# Patient Record
Sex: Male | Born: 1990 | Race: Black or African American | Hispanic: No | Marital: Single | Smoking: Current some day smoker
Health system: Southern US, Community
[De-identification: ages and names within clinical notes are randomized; demographics above are authoritative.]

---
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
## 2012-09-02 ENCOUNTER — Encounter (HOSPITAL_COMMUNITY): Payer: Self-pay | Admitting: *Deleted

## 2012-09-02 ENCOUNTER — Emergency Department (HOSPITAL_COMMUNITY)
Admission: EM | Admit: 2012-09-02 | Discharge: 2012-09-02 | Disposition: A | Discharge status: Home / Self Care | Payer: Medicaid Other | Attending: Emergency Medicine | Admitting: Emergency Medicine

## 2012-09-02 DIAGNOSIS — R51 Headache: Secondary | ICD-10-CM | POA: Insufficient documentation

## 2012-09-02 DIAGNOSIS — H53149 Visual discomfort, unspecified: Secondary | ICD-10-CM | POA: Insufficient documentation

## 2012-09-02 MED ORDER — KETOROLAC TROMETHAMINE 60 MG/2ML IM SOLN
60.0000 mg | Freq: Once | INTRAMUSCULAR | Status: AC
  Administered 2012-09-02: 60 mg via INTRAMUSCULAR
  Filled 2012-09-02: qty 2

## 2012-09-02 NOTE — ED Provider Notes (Signed)
  I performed a history and physical examination of Jimmy Barr and discussed his management with Dr. Freida Busman  I agree with the history, physical, assessment, and plan of care, with the following exceptions: None  On my exam this young male was in no distress.  Given his description of a gradually developing headache, the absence of distress, neurologic changes, there is little suspicion for subarachnoid or meningitis.  The patient had resolution of his pain with analgesics.  He was discharged in stable condition.  Elyse Jarvis, MD 09/02/12 [phone]

## 2012-09-02 NOTE — ED Notes (Signed)
Pt here c/o H/A that started last night.  Pt denies taking any medications for pain relief.

## 2012-09-02 NOTE — ED Provider Notes (Signed)
History     CSN: [id number]  Arrival date & time 09/02/12  5784   First MD Initiated Contact with Patient 09/02/12 0845     Chief Complaint  Patient presents with  . Headache   HPI: Jimmy Barr is a 21 yo AAM with no past medical history presents with headache. Symptoms started last night at 9 pm while watching TV. Pain described as throbbing, located in bilateral parietal regions, migrated to occiput over a couple of hours. Took >30 minutes to reach maximum intensity of 9/10. Pain then began to improve slowly but did not resolve completely. He did not take any medications for relief. Associated with photophobia and feeling of warmth. Two hours ago intensity of pain began to worsen again so he presents for evaluation. Currently pain is a 5/10. He denies nausea, vomiting, extremity weakness, neck pain or fever. His mother has history of migraines. He has never had a headache similar to this previously.   History reviewed. No pertinent past medical history.  History reviewed. No pertinent past surgical history.  No family history on file.  History  Substance Use Topics  . Smoking status: Never Smoker   . Smokeless tobacco: Not on file  . Alcohol Use: Yes     Comment: social     Review of Systems  Constitutional: Negative for fever, chills and fatigue.  HENT: Negative for trouble swallowing, neck pain and neck stiffness.   Eyes: Positive for photophobia. Negative for pain.  Respiratory: Negative for cough, shortness of breath and wheezing.   Cardiovascular: Negative for chest pain and palpitations.  Gastrointestinal: Negative for nausea, vomiting and abdominal pain.  Genitourinary: Negative for dysuria, urgency and decreased urine volume.  Musculoskeletal: Negative for myalgias, back pain and arthralgias.  Skin: Negative for pallor and rash.  Neurological: Positive for headaches. Negative for dizziness, seizures, syncope and weakness.  Psychiatric/Behavioral: Negative for  confusion and agitation.   Allergies  Review of patient's allergies indicates no known allergies.  Home Medications  No current outpatient prescriptions on file.  BP 125/89  Pulse 79  Temp 98.1 F (36.7 C) (Oral)  Resp 16  Ht 5\' 6"  (1.676 m)  Wt 173 lb (78.472 kg)  BMI 27.92 kg/m2  SpO2 100%  Physical Exam  Vitals reviewed. Constitutional: He is oriented to person, place, and time. He appears well-developed and well-nourished. He is cooperative. No distress.  HENT:  Head: Normocephalic and atraumatic.  Mouth/Throat: No oropharyngeal exudate.  Eyes: Conjunctivae normal and EOM are normal. Pupils are equal, round, and reactive to light.  Neck: Normal range of motion. Neck supple. No JVD present.  Cardiovascular: Normal rate, regular rhythm and intact distal pulses.   Pulmonary/Chest: Effort normal and breath sounds normal. He exhibits no tenderness.  Abdominal: Soft. Bowel sounds are normal. There is no tenderness. There is no rebound.  Musculoskeletal: Normal range of motion.  Neurological: He is alert and oriented to person, place, and time. He has normal strength and normal reflexes. No cranial nerve deficit or sensory deficit. GCS eye subscore is 4. GCS verbal subscore is 5. GCS motor subscore is 6.  Skin: Skin is warm and dry.   ED Course  Procedures  Labs Reviewed - No data to display No results found.  1. Headache     MDM  21 yo AAM with no past medical history presents with headache. Afebrile, vital signs stable. Doubt SAH due to gradual onset, no neck pain, symptoms improving spontaneously. Doubt meningitis as no fever or neck  pain. Feel this is most likely a tension HA, although this may represent a first-time migraine due to family history. No focal neurologic deficits to warrant head imaging. No other complaints to warrant labs. Treated with Toradol with near resolution of pain. Felt to be stable for outpatient management as HA much improved, he is ambulating  without difficulty, no vomiting. Instructed to f/u with PCP of his choice if headaches continue for further evaluation. Return precautions given.   Discussed case with Dr. Jeraldine Loots  Clinical Impression 1. Headache        Margie Billet, MD 09/02/12 1427

## 2012-09-20 ENCOUNTER — Encounter (HOSPITAL_COMMUNITY): Payer: Self-pay | Admitting: Cardiology

## 2012-09-20 ENCOUNTER — Emergency Department (HOSPITAL_COMMUNITY)
Admission: EM | Admit: 2012-09-20 | Discharge: 2012-09-20 | Disposition: A | Discharge status: Home / Self Care | Payer: Medicaid Other | Attending: Emergency Medicine | Admitting: Emergency Medicine

## 2012-09-20 DIAGNOSIS — R112 Nausea with vomiting, unspecified: Secondary | ICD-10-CM | POA: Insufficient documentation

## 2012-09-20 DIAGNOSIS — R197 Diarrhea, unspecified: Secondary | ICD-10-CM | POA: Insufficient documentation

## 2012-09-20 DIAGNOSIS — R10819 Abdominal tenderness, unspecified site: Secondary | ICD-10-CM | POA: Insufficient documentation

## 2012-09-20 NOTE — ED Notes (Signed)
Pt reports n/v/d since last night. Reports vomiting x3, and loose stools. Also abd tenderness. Denies any fevers.

## 2012-09-20 NOTE — ED Notes (Signed)
Called patient for vital recheck, no answer

## 2013-08-07 ENCOUNTER — Encounter (HOSPITAL_COMMUNITY): Payer: Self-pay | Admitting: Emergency Medicine

## 2013-08-07 ENCOUNTER — Emergency Department (HOSPITAL_COMMUNITY)
Admission: EM | Admit: 2013-08-07 | Discharge: 2013-08-07 | Disposition: A | Discharge status: Home / Self Care | Payer: Medicaid Other | Attending: Emergency Medicine | Admitting: Emergency Medicine

## 2013-08-07 DIAGNOSIS — T679XXA Effect of heat and light, unspecified, initial encounter: Secondary | ICD-10-CM

## 2013-08-07 DIAGNOSIS — Y929 Unspecified place or not applicable: Secondary | ICD-10-CM | POA: Insufficient documentation

## 2013-08-07 DIAGNOSIS — W92XXXA Exposure to excessive heat of man-made origin, initial encounter: Secondary | ICD-10-CM | POA: Insufficient documentation

## 2013-08-07 DIAGNOSIS — Y939 Activity, unspecified: Secondary | ICD-10-CM | POA: Insufficient documentation

## 2013-08-07 DIAGNOSIS — T678XXA Other effects of heat and light, initial encounter: Secondary | ICD-10-CM | POA: Insufficient documentation

## 2013-08-07 LAB — URINALYSIS, ROUTINE W REFLEX MICROSCOPIC
Bilirubin Urine: NEGATIVE
Glucose, UA: NEGATIVE mg/dL
Leukocytes, UA: NEGATIVE
Nitrite: NEGATIVE
Protein, ur: NEGATIVE mg/dL
Urobilinogen, UA: 1 mg/dL (ref 0.0–1.0)

## 2013-08-07 LAB — CBC WITH DIFFERENTIAL/PLATELET
Basophils Absolute: 0 10*3/uL (ref 0.0–0.1)
Basophils Relative: 0 % (ref 0–1)
HCT: 43.7 % (ref 39.0–52.0)
Hemoglobin: 14.6 g/dL (ref 13.0–17.0)
Lymphocytes Relative: 28 % (ref 12–46)
Lymphs Abs: 2.6 10*3/uL (ref 0.7–4.0)
MCH: 26.3 pg (ref 26.0–34.0)
MCHC: 33.4 g/dL (ref 30.0–36.0)
MCV: 78.6 fL (ref 78.0–100.0)
Monocytes Absolute: 0.9 10*3/uL (ref 0.1–1.0)
Monocytes Relative: 10 % (ref 3–12)
Neutro Abs: 5.6 10*3/uL (ref 1.7–7.7)
Neutrophils Relative %: 61 % (ref 43–77)
Platelets: 302 10*3/uL (ref 150–400)
RBC: 5.56 MIL/uL (ref 4.22–5.81)
RDW: 14 % (ref 11.5–15.5)
WBC: 9.2 10*3/uL (ref 4.0–10.5)

## 2013-08-07 LAB — COMPREHENSIVE METABOLIC PANEL
ALT: 17 U/L (ref 0–53)
AST: 23 U/L (ref 0–37)
Albumin: 4.1 g/dL (ref 3.5–5.2)
Alkaline Phosphatase: 71 U/L (ref 39–117)
BUN: 11 mg/dL (ref 6–23)
CO2: 24 mEq/L (ref 19–32)
Calcium: 9.6 mg/dL (ref 8.4–10.5)
Chloride: 101 mEq/L (ref 96–112)
Creatinine, Ser: 1.04 mg/dL (ref 0.50–1.35)
GFR calc Af Amer: >90 mL/min (ref >90)
GFR calc non Af Amer: >90 mL/min (ref >90)
Glucose, Bld: 140 mg/dL — ABNORMAL HIGH (ref 70–99)
Potassium: 4.3 mEq/L (ref 3.5–5.1)
Sodium: 137 mEq/L (ref 135–145)
Total Bilirubin: 0.2 mg/dL — ABNORMAL LOW (ref 0.3–1.2)
Total Protein: 8.1 g/dL (ref 6.0–8.3)

## 2013-08-07 MED ORDER — SODIUM CHLORIDE 0.9 % IV BOLUS (SEPSIS)
1000.0000 mL | Freq: Once | INTRAVENOUS | Status: AC
  Administered 2013-08-07: 1000 mL via INTRAVENOUS

## 2013-08-07 MED ORDER — ONDANSETRON HCL 4 MG/2ML IJ SOLN
4.0000 mg | Freq: Once | INTRAMUSCULAR | Status: AC
  Administered 2013-08-07: 4 mg via INTRAVENOUS
  Filled 2013-08-07: qty 2

## 2013-08-07 MED ORDER — PROMETHAZINE HCL 25 MG PO TABS: 25.0000 mg | ORAL_TABLET | Freq: Four times a day (QID) | ORAL | Status: DC | PRN

## 2013-08-07 NOTE — ED Provider Notes (Signed)
CSN: [id number]     Arrival date & time 08/07/13  0531 History   None    Chief Complaint  Patient presents with  . Influenza    Flu like symptoms   (Consider location/radiation/quality/duration/timing/severity/associated sxs/prior Treatment) Patient is a 22 y.o. male presenting with flu symptoms. The history is provided by the patient. No language interpreter was used.  Influenza Presenting symptoms: fever   Severity:  Moderate Onset quality:  Sudden Progression:  Worsening Chronicity:  New Relieved by:  Nothing Worsened by:  Nothing tried Ineffective treatments:  Drinking Associated symptoms: decreased physical activity   Risk factors: not elderly   Pt reports he was lying over the heat vent at home and began feeling very hot.   Pt is unsure if he was overheated or if he has a fever  History reviewed. No pertinent past medical history. History reviewed. No pertinent past surgical history. No family history on file. History  Substance Use Topics  . Smoking status: Never Smoker   . Smokeless tobacco: Not on file  . Alcohol Use: Yes     Comment: social    Review of Systems  Constitutional: Positive for fever.  Skin: Positive for color change.  All other systems reviewed and are negative.    Allergies  Review of patient's allergies indicates no known allergies.  Home Medications  No current outpatient prescriptions on file. BP 113/71  Pulse 65  Temp(Src) 97.8 F (36.6 C) (Oral)  Resp 17  SpO2 98% Physical Exam  Nursing note and vitals reviewed. Constitutional: He appears well-developed and well-nourished.  HENT:  Head: Normocephalic.  Right Ear: External ear normal.  Left Ear: External ear normal.  Nose: Nose normal.  Mouth/Throat: Oropharynx is clear and moist.  Eyes: Pupils are equal, round, and reactive to light.  Neck: Normal range of motion.  Cardiovascular: Normal rate and regular rhythm.   Pulmonary/Chest: Effort normal and breath sounds normal.   Abdominal: Soft. Bowel sounds are normal.  Musculoskeletal: Normal range of motion.  Neurological: He is alert.  Skin: Skin is warm.  Psychiatric: He has a normal mood and affect.    ED Course  Procedures (including critical care time) Labs Review Labs Reviewed  COMPREHENSIVE METABOLIC PANEL - Abnormal; Notable for the following:    Glucose, Bld 140 (*)    Total Bilirubin 0.2 (*)    All other components within normal limits  URINALYSIS, ROUTINE W REFLEX MICROSCOPIC - Abnormal; Notable for the following:    APPearance CLOUDY (*)    Ketones, ur 15 (*)    All other components within normal limits  CBC WITH DIFFERENTIAL   Imaging Review No results found.  EKG Interpretation   None       MDM   1. Heat exposure    Pt given IV fluids,    Pt feels better.   Pt advised tylenol at home.  See your primary Md for recheck if symptoms persist    Elson Areas, [place] 08/07/13 7829

## 2013-08-07 NOTE — ED Notes (Signed)
Patient reports experiencing n/v this am as well as episodes of "passing out." Patient is also reporting that he had a "heat-stroke."

## 2013-08-10 NOTE — ED Provider Notes (Signed)
Medical screening examination/treatment/procedure(s) were performed by non-physician practitioner and as supervising physician I was immediately available for consultation/collaboration.     Brandt Loosen, MD 08/10/13 [phone]

## 2016-08-26 ENCOUNTER — Emergency Department (HOSPITAL_COMMUNITY)
Admission: EM | Admit: 2016-08-26 | Discharge: 2016-08-27 | Disposition: A | Discharge status: Home / Self Care | Payer: Medicaid Other | Attending: Emergency Medicine | Admitting: Emergency Medicine

## 2016-08-26 ENCOUNTER — Encounter (HOSPITAL_COMMUNITY): Payer: Self-pay | Admitting: Pharmacy Technician

## 2016-08-26 DIAGNOSIS — R2232 Localized swelling, mass and lump, left upper limb: Secondary | ICD-10-CM | POA: Insufficient documentation

## 2016-08-26 DIAGNOSIS — M7981 Nontraumatic hematoma of soft tissue: Secondary | ICD-10-CM | POA: Insufficient documentation

## 2016-08-26 DIAGNOSIS — M7989 Other specified soft tissue disorders: Secondary | ICD-10-CM

## 2016-08-26 DIAGNOSIS — F17200 Nicotine dependence, unspecified, uncomplicated: Secondary | ICD-10-CM | POA: Insufficient documentation

## 2016-08-26 NOTE — ED Triage Notes (Signed)
Pt arrives to the ED with complaints of swelling and pain in his L arm above the injection site from donating plasma. Pt was at the plasma donation center today and states they had to stick him twice due to machine malfunction. Pt states they had trouble getting the vein in the left arm and when they initially started the procedure it did not feel right. States after they finished getting the plasma and were returning blood into his arm it started swelling. Pt states that it has gotten more swollen throughout the day.

## 2016-08-27 ENCOUNTER — Inpatient Hospital Stay (HOSPITAL_COMMUNITY): Admission: RE | Admit: 2016-08-27 | Payer: Medicaid Other | Source: Ambulatory Visit

## 2016-08-27 NOTE — ED Provider Notes (Signed)
[facility]EM[facility]Y DEPT Provider Note   CSN: [id number] Arrival date & time: 08/26/16  2251     History   Chief Complaint No chief complaint on file.   HPI Jimmy Barr is a 25 y.o. male.  HPI   Patient is a 25 year old male with no pertinent past medical history presents the ED with complaint of left arm swelling and bruising, onset 8:30 AM. Patient reports donating plasma this morning and states when they were returning his blood back he began to have small amount of swelling to his left arm above where his IV was present. He notes his afternoon he began having bruising to the site. Endorses associated mild tenderness with certain movements of his left arm. Patient reports his mother was concerned he may have a blood clot in his arm resulting in him coming to the ED for evaluation. Patient denies personal history of blood clots but reports his and had a blood clot resulting in her losing one of her legs. Denies fever, numbness, tingling, weakness, decreased range of motion, chest pain, shortness of breath.   History reviewed. No pertinent past medical history.  There are no active problems to display for this patient.   History reviewed. No pertinent surgical history.     Home Medications    Prior to Admission medications   Medication Sig Start Date End Date Taking? Authorizing Provider  promethazine (PHENERGAN) 25 MG tablet Take 1 tablet (25 mg total) by mouth every 6 (six) hours as needed for nausea or vomiting. 08/07/13   Elson AreasLeslie K Sofia, PA-C    Family History History reviewed. No pertinent family history.  Social History Social History  Substance Use Topics  . Smoking status: Current Some Day Smoker    Packs/day: 0.10  . Smokeless tobacco: Never Used  . Alcohol use Yes     Comment: social     Allergies   Patient has no known allergies.   Review of Systems Review of Systems  Musculoskeletal: Positive for myalgias (left arm).       Left arm swelling and  bruising  All other systems reviewed and are negative.    Physical Exam Updated Vital Signs BP 135/83 (BP Location: Right Arm)   Pulse 77   Temp 98.3 F (36.8 C) (Oral)   Resp 18   Ht 5\' 6"  (1.676 m)   Wt 93.4 kg   SpO2 98%   BMI 33.25 kg/m   Physical Exam  Constitutional: He is oriented to person, place, and time. He appears well-developed and well-nourished.  HENT:  Head: Normocephalic and atraumatic.  Eyes: Conjunctivae and EOM are normal. Right eye exhibits no discharge. Left eye exhibits no discharge. No scleral icterus.  Neck: Normal range of motion. Neck supple.  Cardiovascular: Normal rate, regular rhythm, normal heart sounds and intact distal pulses.   Pulmonary/Chest: Effort normal and breath sounds normal. No respiratory distress. He has no wheezes. He has no rales. He exhibits no tenderness.  Abdominal: Soft.  Musculoskeletal: Normal range of motion. He exhibits tenderness. He exhibits no edema or deformity.  Mild swelling and ecchymoses noted to left antecubital space; mild tenderness palpation. No erythema, warmth or streaking noted. Full range of motion of left upper extremity with 5 out of 5 strength. Sensation grossly intact. 2+ radial pulse. Cap refill less than 2.  Neurological: He is alert and oriented to person, place, and time.  Skin: Skin is warm and dry.  Nursing note and vitals reviewed.    ED Treatments /  Results  Labs (all labs ordered are listed, but only abnormal results are displayed) Labs Reviewed - No data to display  EKG  EKG Interpretation None       Radiology No results found.  Procedures Procedures (including critical care time)  Medications Ordered in ED Medications - No data to display   Initial Impression / Assessment and Plan / ED Course  I have reviewed the triage vital signs and the nursing notes.  Pertinent labs & imaging results that were available during my care of the patient were reviewed by me and considered  in my medical decision making (see chart for details).  Clinical Course     Patient presents with mild swelling and bruising to his left arm after donating plasma this morning. He reports his mother was concerned about him having a blood clot resulting in him coming to the ED. Endorses family history of blood clot, denies personal history. VSS. Exam revealed mild swelling and ecchymoses to left antecubital space. Left upper extremity neurovascularly intact. No signs of infection/cellulitis. No streaking. Plan to discharge patient home with outpatient Doppler study to be performed in the morning for evaluation of blood clot. Discussed plan with patient along with instructions regarding tomorrow as follow-up procedure. Patient reports understanding and agreement. Discussed return precautions.  Final Clinical Impressions(s) / ED Diagnoses   Final diagnoses:  Left arm swelling    New Prescriptions New Prescriptions   No medications on file     Barrett Henleicole Elizabeth Tamotsu Wiederholt, PA-C 08/27/16 0033    Dione Boozeavid Glick, MD 08/27/16 [phone]

## 2016-08-27 NOTE — ED Notes (Signed)
Patient Alert and oriented X4. Stable and ambulatory. Patient verbalized understanding of the discharge instructions.  Patient belongings were taken by the patient.  

## 2016-08-27 NOTE — ED Notes (Signed)
Patient is A&Ox4 at this time.  Patient in no signs of distress.  Please see providers note for complete history and physical exam.  

## 2016-08-27 NOTE — Discharge Instructions (Signed)
Follow the instructions listed below to return to the hospital tomorrow to have your left upper extremity ultrasound performed for evaluation of a blood clot. Please return to the Emergency Department if symptoms worsen or new onset of fever, redness, swelling, numbness, tingling, weakness, decreased range of motion, chest pain, shortness of breath.

## 2016-08-28 ENCOUNTER — Ambulatory Visit (HOSPITAL_COMMUNITY)
Admission: RE | Admit: 2016-08-28 | Discharge: 2016-08-28 | Disposition: A | Discharge status: Home / Self Care | Payer: Medicaid Other | Source: Ambulatory Visit | Attending: Emergency Medicine | Admitting: Emergency Medicine

## 2016-08-28 DIAGNOSIS — M7989 Other specified soft tissue disorders: Secondary | ICD-10-CM | POA: Insufficient documentation

## 2016-08-28 DIAGNOSIS — M79609 Pain in unspecified limb: Secondary | ICD-10-CM

## 2016-08-28 NOTE — Progress Notes (Signed)
VASCULAR LAB PRELIMINARY  PRELIMINARY  PRELIMINARY  PRELIMINARY  Left upper extremity venous duplex completed.    Preliminary report:  There is no DVT or SVT noted in the left upper extremity.   Anquan Azzarello, RVT 08/28/2016, 9:53 AM

## 2017-02-06 ENCOUNTER — Encounter (HOSPITAL_COMMUNITY): Payer: Self-pay

## 2017-02-06 ENCOUNTER — Emergency Department (HOSPITAL_COMMUNITY): Payer: Self-pay

## 2017-02-06 ENCOUNTER — Emergency Department (HOSPITAL_COMMUNITY)
Admission: EM | Admit: 2017-02-06 | Discharge: 2017-02-06 | Disposition: A | Discharge status: Home / Self Care | Payer: Self-pay | Attending: Emergency Medicine | Admitting: Emergency Medicine

## 2017-02-06 DIAGNOSIS — R112 Nausea with vomiting, unspecified: Secondary | ICD-10-CM | POA: Insufficient documentation

## 2017-02-06 DIAGNOSIS — F17200 Nicotine dependence, unspecified, uncomplicated: Secondary | ICD-10-CM | POA: Insufficient documentation

## 2017-02-06 DIAGNOSIS — R42 Dizziness and giddiness: Secondary | ICD-10-CM | POA: Insufficient documentation

## 2017-02-06 DIAGNOSIS — Z79899 Other long term (current) drug therapy: Secondary | ICD-10-CM | POA: Insufficient documentation

## 2017-02-06 LAB — BASIC METABOLIC PANEL
Anion gap: 8 (ref 5–15)
BUN: 8 mg/dL (ref 6–20)
CO2: 26 mmol/L (ref 22–32)
Calcium: 9.3 mg/dL (ref 8.9–10.3)
Chloride: 104 mmol/L (ref 101–111)
Creatinine, Ser: 1.23 mg/dL (ref 0.61–1.24)
GFR calc Af Amer: >60 mL/min (ref >60)
GFR calc non Af Amer: >60 mL/min (ref >60)
Glucose, Bld: 147 mg/dL — ABNORMAL HIGH (ref 65–99)
Potassium: 3.7 mmol/L (ref 3.5–5.1)
Sodium: 138 mmol/L (ref 135–145)

## 2017-02-06 LAB — CBC WITH DIFFERENTIAL/PLATELET
Basophils Absolute: 0 10*3/uL (ref 0.0–0.1)
Basophils Relative: 0 %
Eosinophils Absolute: 0.2 10*3/uL (ref 0.0–0.7)
Eosinophils Relative: 2 %
HCT: 43.1 % (ref 39.0–52.0)
Hemoglobin: 14 g/dL (ref 13.0–17.0)
Lymphocytes Relative: 26 %
Lymphs Abs: 1.7 10*3/uL (ref 0.7–4.0)
MCH: 26.4 pg (ref 26.0–34.0)
MCHC: 32.5 g/dL (ref 30.0–36.0)
MCV: 81.2 fL (ref 78.0–100.0)
Monocytes Absolute: 0.6 10*3/uL (ref 0.1–1.0)
Monocytes Relative: 10 %
Neutro Abs: 4 10*3/uL (ref 1.7–7.7)
Neutrophils Relative %: 62 %
Platelets: 270 10*3/uL (ref 150–400)
RBC: 5.31 MIL/uL (ref 4.22–5.81)
RDW: 15.2 % (ref 11.5–15.5)
WBC: 6.5 10*3/uL (ref 4.0–10.5)

## 2017-02-06 LAB — URINALYSIS, ROUTINE W REFLEX MICROSCOPIC
Bacteria, UA: NONE SEEN
Bilirubin Urine: NEGATIVE
Glucose, UA: NEGATIVE mg/dL
Hgb urine dipstick: NEGATIVE
Ketones, ur: 20 mg/dL — AB
Leukocytes, UA: NEGATIVE
Nitrite: NEGATIVE
Protein, ur: 30 mg/dL — AB
Specific Gravity, Urine: 1.027 (ref 1.005–1.030)
pH: 6 (ref 5.0–8.0)

## 2017-02-06 MED ORDER — LORAZEPAM 2 MG/ML IJ SOLN
2.0000 mg | Freq: Once | INTRAMUSCULAR | Status: AC
  Administered 2017-02-06: 2 mg via INTRAVENOUS
  Filled 2017-02-06: qty 1

## 2017-02-06 MED ORDER — SODIUM CHLORIDE 0.9 % IV BOLUS (SEPSIS)
1000.0000 mL | Freq: Once | INTRAVENOUS | Status: AC
  Administered 2017-02-06: 1000 mL via INTRAVENOUS

## 2017-02-06 MED ORDER — ONDANSETRON HCL 4 MG PO TABS: 4.0000 mg | ORAL_TABLET | Freq: Four times a day (QID) | ORAL | 0 refills | Status: DC

## 2017-02-06 MED ORDER — DIPHENHYDRAMINE HCL 50 MG/ML IJ SOLN
25.0000 mg | Freq: Once | INTRAMUSCULAR | Status: AC
  Administered 2017-02-06: 25 mg via INTRAVENOUS
  Filled 2017-02-06: qty 1

## 2017-02-06 MED ORDER — MECLIZINE HCL 25 MG PO TABS
25.0000 mg | ORAL_TABLET | Freq: Once | ORAL | Status: AC
Start: 2017-02-06 — End: 2017-02-06
  Administered 2017-02-06: 25 mg via ORAL
  Filled 2017-02-06: qty 1

## 2017-02-06 MED ORDER — SODIUM CHLORIDE 0.9 % IV BOLUS (SEPSIS): 1000.0000 mL | Freq: Once | INTRAVENOUS | Status: DC

## 2017-02-06 MED ORDER — ONDANSETRON HCL 4 MG/2ML IJ SOLN
4.0000 mg | Freq: Once | INTRAMUSCULAR | Status: AC
Start: 2017-02-06 — End: 2017-02-06
  Administered 2017-02-06: 4 mg via INTRAVENOUS
  Filled 2017-02-06: qty 2

## 2017-02-06 MED ORDER — MECLIZINE HCL 25 MG PO TABS: 25.0000 mg | ORAL_TABLET | Freq: Three times a day (TID) | ORAL | 0 refills | Status: DC | PRN

## 2017-02-06 NOTE — ED Triage Notes (Signed)
Pr Pt, Pt is coming from home with complaints of dizziness and unsteady gait that started last night at 0000 when he woke up from his sleep to go to the bathroom. Pt reports waking up again this morning and having nausea and vomiting. Reports dizziness and lightheadedness continued.

## 2017-02-06 NOTE — ED Notes (Addendum)
Patient is resting comfortably; returned from CT; no needs

## 2017-02-06 NOTE — ED Provider Notes (Signed)
[facility] Provider Note   CSN: [id number] Arrival date & time: 02/06/17  0849     History   Chief Complaint Chief Complaint  Patient presents with  . Dizziness  . Nausea    HPI Jimmy E Derrell Lollingngram is a 26 y.o. male who is previously healthy who presents with sudden onset of dizziness around 12 AM this morning. Patient states he woke up in the middle night and then began feeling dizzy, feeling the room is spinning. He has had associated nausea and vomiting. Patient states his symptoms are worse with moving his head and walking. Patient reports having a gradual onset headache yesterday that resolved with one ibuprofen, but otherwise felt normal yesterday. Patient does feel like he has had a decreased appetite in the past couple weeks. Patient reports feeling the urge to urinate, but has not been able to. He last urinated last night. He has been drinking fluids since that time. Patient denies any pain, but does describe a fullness in his head. He denies any fevers, chest pain, shortness of breath, ear pain, abdominal pain. Patient does note drinking a lot of alcohol 2 days ago at a party. He has not very much water over the past couple days.  HPI  History reviewed. No pertinent past medical history.  There are no active problems to display for this patient.   History reviewed. No pertinent surgical history.     Home Medications    Prior to Admission medications   Medication Sig Start Date End Date Taking? Authorizing Provider  ibuprofen (ADVIL,MOTRIN) 200 MG tablet Take 200 mg by mouth every 6 (six) hours as needed for mild pain.   Yes [provider]  meclizine (ANTIVERT) 25 MG tablet Take 1 tablet (25 mg total) by mouth 3 (three) times daily as needed for dizziness. 02/06/17   Raigan Baria, Waylan BogaAlexandra M, PA-C  ondansetron (ZOFRAN) 4 MG tablet Take 1 tablet (4 mg total) by mouth every 6 (six) hours. 02/06/17   Odysseus Cada, Waylan BogaAlexandra M, PA-C  promethazine (PHENERGAN) 25 MG tablet  Take 1 tablet (25 mg total) by mouth every 6 (six) hours as needed for nausea or vomiting. Patient not taking: Reported on 02/06/2017 08/07/13   Osie CheeksSofia, Leslie K, PA-C    Family History No family history on file.  Social History Social History  Substance Use Topics  . Smoking status: Current Some Day Smoker    Packs/day: 0.10  . Smokeless tobacco: Never Used  . Alcohol use Yes     Comment: social     Allergies   Patient has no known allergies.   Review of Systems Review of Systems  Constitutional: Positive for appetite change. Negative for chills and fever.  HENT: Negative for facial swelling and sore throat.   Respiratory: Negative for shortness of breath.   Cardiovascular: Negative for chest pain.  Gastrointestinal: Positive for nausea and vomiting. Negative for abdominal pain.  Genitourinary: Positive for decreased urine volume, difficulty urinating and urgency. Negative for dysuria.  Musculoskeletal: Negative for back pain.  Skin: Negative for rash and wound.  Neurological: Positive for dizziness and light-headedness. Negative for headaches.  Psychiatric/Behavioral: The patient is not nervous/anxious.      Physical Exam Updated Vital Signs BP (!) 102/58   Pulse 75   Temp 97.9 F (36.6 C) (Oral)   Resp 16   Ht 5\' 7"  (1.702 m)   Wt 180 lb (81.6 kg)   SpO2 97%   BMI 28.19 kg/m   Physical Exam  Constitutional: He  appears well-developed and well-nourished. No distress.  HENT:  Head: Normocephalic and atraumatic.  Right Ear: Tympanic membrane normal.  Left Ear: Tympanic membrane normal.  Mouth/Throat: Oropharynx is clear and moist. No oropharyngeal exudate.  Eyes: Conjunctivae are normal. Pupils are equal, round, and reactive to light. Right eye exhibits no discharge. Left eye exhibits no discharge. No scleral icterus.  Neck: Normal range of motion. Neck supple. No thyromegaly present.  Cardiovascular: Normal rate, regular rhythm, normal heart sounds and intact  distal pulses.  Exam reveals no gallop and no friction rub.   No murmur heard. Pulmonary/Chest: Effort normal and breath sounds normal. No stridor. No respiratory distress. He has no wheezes. He has no rales.  Abdominal: Soft. Bowel sounds are normal. He exhibits no distension. There is no tenderness. There is no rebound and no guarding.  Musculoskeletal: He exhibits no edema.  Lymphadenopathy:    He has no cervical adenopathy.  Neurological: He is alert. Coordination normal.  CN 3-12 intact; nystagmus noted with and without EOMs; normal sensation throughout; 5/5 strength in all 4 extremities; equal bilateral grip strength  Skin: Skin is warm and dry. No rash noted. He is not diaphoretic. No pallor.  Psychiatric: He has a normal mood and affect.  Nursing note and vitals reviewed.    ED Treatments / Results  Labs (all labs ordered are listed, but only abnormal results are displayed) Labs Reviewed  URINALYSIS, ROUTINE W REFLEX MICROSCOPIC - Abnormal; Notable for the following:       Result Value   Ketones, ur 20 (*)    Protein, ur 30 (*)    Squamous Epithelial / LPF 0-5 (*)    All other components within normal limits  BASIC METABOLIC PANEL - Abnormal; Notable for the following:    Glucose, Bld 147 (*)    All other components within normal limits  CBC WITH DIFFERENTIAL/PLATELET    EKG  EKG Interpretation None       Radiology Ct Head Wo Contrast  Result Date: 02/06/2017 CLINICAL DATA:  Vertigo EXAM: CT HEAD WITHOUT CONTRAST TECHNIQUE: Contiguous axial images were obtained from the base of the skull through the vertex without intravenous contrast. COMPARISON:  None. FINDINGS: Brain: No evidence of acute infarction, hemorrhage, hydrocephalus, extra-axial collection or mass lesion/mass effect. Vascular: No hyperdense vessel or unexpected calcification. Skull: Negative Sinuses/Orbits: Negative Other: None IMPRESSION: Negative CT head Electronically Signed   By: Marlan Palau M.D.    On: 02/06/2017 13:31    Procedures Procedures (including critical care time)  Medications Ordered in ED Medications  diphenhydrAMINE (BENADRYL) injection 25 mg (25 mg Intravenous Given 02/06/17 0931)  ondansetron (ZOFRAN) injection 4 mg (4 mg Intravenous Given 02/06/17 0931)  sodium chloride 0.9 % bolus 1,000 mL (0 mLs Intravenous Stopped 02/06/17 1037)  meclizine (ANTIVERT) tablet 25 mg (25 mg Oral Given 02/06/17 1036)  sodium chloride 0.9 % bolus 1,000 mL (0 mLs Intravenous Stopped 02/06/17 1348)  LORazepam (ATIVAN) injection 2 mg (2 mg Intravenous Given 02/06/17 1346)     Initial Impression / Assessment and Plan / ED Course  I have reviewed the triage vital signs and the nursing notes.  Pertinent labs & imaging results that were available during my care of the patient were reviewed by me and considered in my medical decision making (see chart for details).     Previously healthy 26 year old male presenting with vertigo. CBC, BMP unremarkable. UA shows 20 ketones and 30 protein. CT head is negative. Patient with no focal deficits on neuro  exam. Vertigo represents probably peripheral etiology. Patient appeared very dehydrated. Vertigo possibly caused by this etiology. Patient's nausea controlled with Zofran. Vertigo slightly improved with Antivert, the patient still complaining of dizziness, however he is able to ambulate without difficulty in the department prior to discharge. Will discharge patient home with Zofran, Antivert with ENT for further evaluation and treatment as needed. Return precautions discussed. Patient understands and agrees with plan. Patient vitals stable throughout ED course and discharged in satisfactory condition.  Final Clinical Impressions(s) / ED Diagnoses   Final diagnoses:  Vertigo  Non-intractable vomiting with nausea, unspecified vomiting type    New Prescriptions Discharge Medication List as of 02/06/2017  2:19 PM    START taking these medications    Details  meclizine (ANTIVERT) 25 MG tablet Take 1 tablet (25 mg total) by mouth 3 (three) times daily as needed for dizziness., Starting Fri 02/06/2017, Print    ondansetron (ZOFRAN) 4 MG tablet Take 1 tablet (4 mg total) by mouth every 6 (six) hours., Starting Fri 02/06/2017, Print         Yuri Fana, [place], PA-C 02/06/17 1556    Rolland Porter, MD 02/17/17 1745

## 2017-02-06 NOTE — Discharge Instructions (Signed)
Medications: Zofran, Antivert  Treatment: Take Zofran every 6 hours as needed for nausea or vomiting. Take Antivert 3 times daily as needed for dizziness. Make sure to drink plenty of fluids, at least a glasses of water daily. It may take a few days for you to feel better.  Follow-up: Please follow-up with the ear nose and throat doctor, Dr. Lazarus SalinesWolicki, if your symptoms are persisting. Please return to emergency department if you develop any new or worsening symptoms including severe headache, numbness or weakness of the extremities, inability to walk, or any other new or concerning symptoms.

## 2017-02-06 NOTE — ED Notes (Signed)
Pt aware that urine sample is needed.  

## 2017-02-06 NOTE — ED Notes (Signed)
Patient is resting comfortably. 

## 2017-02-06 NOTE — ED Notes (Signed)
Discharge vitals complete and IV out.

## 2017-02-06 NOTE — ED Notes (Signed)
PT reports feels like room is spinning; better when he shuts eyes. Neuro intact; no deficits.

## 2018-11-10 ENCOUNTER — Encounter (HOSPITAL_COMMUNITY): Payer: Self-pay | Admitting: *Deleted

## 2018-11-10 ENCOUNTER — Encounter (HOSPITAL_COMMUNITY): Payer: Self-pay | Admitting: Emergency Medicine

## 2018-11-10 ENCOUNTER — Emergency Department (HOSPITAL_COMMUNITY): Payer: Managed Care, Other (non HMO)

## 2018-11-10 ENCOUNTER — Ambulatory Visit (INDEPENDENT_AMBULATORY_CARE_PROVIDER_SITE_OTHER)
Admission: EM | Admit: 2018-11-10 | Discharge: 2018-11-10 | Disposition: A | Discharge status: Home / Self Care | Payer: Managed Care, Other (non HMO) | Source: Home / Self Care

## 2018-11-10 ENCOUNTER — Emergency Department (HOSPITAL_COMMUNITY)
Admission: EM | Admit: 2018-11-10 | Discharge: 2018-11-10 | Disposition: A | Discharge status: Home / Self Care | Payer: Managed Care, Other (non HMO) | Attending: Emergency Medicine | Admitting: Emergency Medicine

## 2018-11-10 DIAGNOSIS — J101 Influenza due to other identified influenza virus with other respiratory manifestations: Secondary | ICD-10-CM | POA: Diagnosis not present

## 2018-11-10 DIAGNOSIS — R630 Anorexia: Secondary | ICD-10-CM

## 2018-11-10 DIAGNOSIS — E860 Dehydration: Secondary | ICD-10-CM | POA: Insufficient documentation

## 2018-11-10 DIAGNOSIS — F17200 Nicotine dependence, unspecified, uncomplicated: Secondary | ICD-10-CM | POA: Insufficient documentation

## 2018-11-10 DIAGNOSIS — R34 Anuria and oliguria: Secondary | ICD-10-CM

## 2018-11-10 DIAGNOSIS — R110 Nausea: Secondary | ICD-10-CM | POA: Diagnosis present

## 2018-11-10 DIAGNOSIS — M791 Myalgia, unspecified site: Secondary | ICD-10-CM

## 2018-11-10 LAB — COMPREHENSIVE METABOLIC PANEL
ALBUMIN: 4 g/dL (ref 3.5–5.0)
ALK PHOS: 36 U/L — AB (ref 38–126)
ALT: 17 U/L (ref 0–44)
AST: 29 U/L (ref 15–41)
Anion gap: 11 (ref 5–15)
BUN: 14 mg/dL (ref 6–20)
CALCIUM: 9.2 mg/dL (ref 8.9–10.3)
CO2: 24 mmol/L (ref 22–32)
CREATININE: 1.4 mg/dL — AB (ref 0.61–1.24)
Chloride: 101 mmol/L (ref 98–111)
GFR calc Af Amer: >60 mL/min (ref >60)
GFR calc non Af Amer: >60 mL/min (ref >60)
Glucose, Bld: 88 mg/dL (ref 70–99)
Potassium: 4.1 mmol/L (ref 3.5–5.1)
Sodium: 136 mmol/L (ref 135–145)
Total Bilirubin: 1 mg/dL (ref 0.3–1.2)
Total Protein: 7.8 g/dL (ref 6.5–8.1)

## 2018-11-10 LAB — POCT URINALYSIS DIP (DEVICE)
GLUCOSE, UA: NEGATIVE mg/dL
KETONES UR: 80 mg/dL — AB
Nitrite: NEGATIVE
Protein, ur: 100 mg/dL — AB
Specific Gravity, Urine: >=1.03 (ref 1.005–1.030)
Urobilinogen, UA: 1 mg/dL (ref 0.0–1.0)
pH: 6 (ref 5.0–8.0)

## 2018-11-10 LAB — CBC
HCT: 46.9 % (ref 39.0–52.0)
Hemoglobin: 14.6 g/dL (ref 13.0–17.0)
MCH: 26.2 pg (ref 26.0–34.0)
MCHC: 31.1 g/dL (ref 30.0–36.0)
MCV: 84.1 fL (ref 80.0–100.0)
PLATELETS: 226 10*3/uL (ref 150–400)
RBC: 5.58 MIL/uL (ref 4.22–5.81)
RDW: 13.4 % (ref 11.5–15.5)
WBC: 4.6 10*3/uL (ref 4.0–10.5)
nRBC: 0 % (ref 0.0–0.2)

## 2018-11-10 LAB — GLUCOSE, CAPILLARY: Glucose-Capillary: 75 mg/dL (ref 70–99)

## 2018-11-10 LAB — LIPASE, BLOOD: Lipase: 33 U/L (ref 11–51)

## 2018-11-10 LAB — CK: Total CK: 233 U/L (ref 49–397)

## 2018-11-10 LAB — INFLUENZA PANEL BY PCR (TYPE A & B)
Influenza A By PCR: NEGATIVE
Influenza B By PCR: POSITIVE — AB

## 2018-11-10 MED ORDER — SODIUM CHLORIDE 0.9% FLUSH: 3.0000 mL | Freq: Once | INTRAVENOUS | Status: DC

## 2018-11-10 MED ORDER — IBUPROFEN 600 MG PO TABS: 600.0000 mg | ORAL_TABLET | Freq: Four times a day (QID) | ORAL | 0 refills | Status: DC | PRN

## 2018-11-10 MED ORDER — ONDANSETRON 4 MG PO TBDP: 4.0000 mg | ORAL_TABLET | Freq: Three times a day (TID) | ORAL | 0 refills | Status: DC | PRN

## 2018-11-10 MED ORDER — KETOROLAC TROMETHAMINE 30 MG/ML IJ SOLN
30.0000 mg | Freq: Once | INTRAMUSCULAR | Status: AC
  Administered 2018-11-10: 30 mg via INTRAVENOUS
  Filled 2018-11-10: qty 1

## 2018-11-10 MED ORDER — ONDANSETRON HCL 4 MG/2ML IJ SOLN
4.0000 mg | Freq: Once | INTRAMUSCULAR | Status: AC
  Administered 2018-11-10: 4 mg via INTRAVENOUS
  Filled 2018-11-10: qty 2

## 2018-11-10 MED ORDER — SODIUM CHLORIDE 0.9 % IV BOLUS
1000.0000 mL | Freq: Once | INTRAVENOUS | Status: AC
  Administered 2018-11-10: 1000 mL via INTRAVENOUS

## 2018-11-10 NOTE — ED Notes (Signed)
Lab reports adding CK on to previous blood draw.

## 2018-11-10 NOTE — ED Triage Notes (Signed)
Pt reports nausea any time he eats and decreased appetite, the last time he ate a full meal he vomited afterward and that was on Friday, c/o muscle cramps and dark urine, sent from urgent care for further evaluation

## 2018-11-10 NOTE — ED Triage Notes (Signed)
Pt states hes not been able to eat a full meal since Friday, states he has no appetite, nausea, when he eats he throws up. Pt c/o chills body aches. States hes drinking water but states he still feels dehydrated. Denies nausea or pain at this time.

## 2018-11-10 NOTE — ED Provider Notes (Signed)
[facility]   [id number] 11/10/18 Arrival Time: 1036  CC: Loss of appetite, nausea, and vomiting  SUBJECTIVE:  Jimmy Barr is a 28 y.o. male who presents with complaint of loss of appetite, nausea, and vomiting x 1 episode, x 5 days.  Admits to recent URI symptoms as well, with girlfriend with similar symptoms.  Also mentions associated calf tightness.  Has tried OTC medications and pushing fluids without relief.  Denies alleviating or aggravating factors. Complains of subjective fever, chills, decreased urine output, and dark urine.    Denies chest pain, SOB, diarrhea, constipation, hematochezia, melena, dysuria, flank pain, loss of bowel or bladder function.  Patient admit to having a "drinking problem" and states he decreased his alcohol intake about a month ago.    ROS: As per HPI.  History reviewed. No pertinent past medical history. History reviewed. No pertinent surgical history. No Known Allergies No current facility-administered medications on file prior to encounter.    Current Outpatient Medications on File Prior to Encounter  Medication Sig Dispense Refill  . ibuprofen (ADVIL,MOTRIN) 200 MG tablet Take 200 mg by mouth every 6 (six) hours as needed for mild pain.    . meclizine (ANTIVERT) 25 MG tablet Take 1 tablet (25 mg total) by mouth 3 (three) times daily as needed for dizziness. (Patient not taking: Reported on 11/10/2018) 30 tablet 0  . ondansetron (ZOFRAN) 4 MG tablet Take 1 tablet (4 mg total) by mouth every 6 (six) hours. (Patient not taking: Reported on 11/10/2018) 12 tablet 0  . promethazine (PHENERGAN) 25 MG tablet Take 1 tablet (25 mg total) by mouth every 6 (six) hours as needed for nausea or vomiting. (Patient not taking: Reported on 02/06/2017) 10 tablet 0   Social History   Socioeconomic History  . Marital status: Single    Spouse name: Not on file  . Number of children: Not on file  . Years of education: Not on file  . Highest education  level: Not on file  Occupational History  . Not on file  Social Needs  . Financial resource strain: Not on file  . Food insecurity:    Worry: Not on file    Inability: Not on file  . Transportation needs:    Medical: Not on file    Non-medical: Not on file  Tobacco Use  . Smoking status: Current Some Day Smoker    Packs/day: 0.10  . Smokeless tobacco: Never Used  Substance and Sexual Activity  . Alcohol use: Yes    Comment: social  . Drug use: Yes    Types: Marijuana  . Sexual activity: Not on file  Lifestyle  . Physical activity:    Days per week: Not on file    Minutes per session: Not on file  . Stress: Not on file  Relationships  . Social connections:    Talks on phone: Not on file    Gets together: Not on file    Attends religious service: Not on file    Active member of club or organization: Not on file    Attends meetings of clubs or organizations: Not on file    Relationship status: Not on file  . Intimate partner violence:    Fear of current or ex partner: Not on file    Emotionally abused: Not on file    Physically abused: Not on file    Forced sexual activity: Not on file  Other Topics Concern  . Not on file  Social History  Narrative  . Not on file   No family history on file.   OBJECTIVE:  Vitals:   11/10/18 1148  BP: 122/73  Pulse: 86  Resp: 18  Temp: 98.9 F (37.2 C)  SpO2: 99%    General appearance: Alert; NAD HEENT: NCAT. Ears: EACs clear, TMs pearly gray; Nose: nares patent without rhinorrhea; Throat: oropharynx clear, tonsils not enlarged or erythematous, uvula midline  Lungs: clear to auscultation bilaterally without adventitious breath sounds Heart: regular rate and rhythm.  Radial pulses 2+ symmetrical bilaterally Abdomen: soft, non-distended; normal active bowel sounds; non-tender to light and deep palpation; nontender at McBurney's point; negative Murphy's sign; no guarding Back: no CVA tenderness Extremities: no edema;  symmetrical with no gross deformities; whole body tremor Skin: warm and dry Neurologic: normal gait Psychological: alert and cooperative; normal mood and affect  LABS: Results for orders placed or performed during the hospital encounter of 11/10/18 (from the past 24 hour(s))  Glucose, capillary     Status: None   Collection Time: 11/10/18 12:14 PM  Result Value Ref Range   Glucose-Capillary 75 70 - 99 mg/dL  POCT urinalysis dip (device)     Status: Abnormal   Collection Time: 11/10/18 12:53 PM  Result Value Ref Range   Glucose, UA NEGATIVE NEGATIVE mg/dL   Bilirubin Urine SMALL (A) NEGATIVE   Ketones, ur 80 (A) NEGATIVE mg/dL   Specific Gravity, Urine >=1.030 1.005 - 1.030   Hgb urine dipstick TRACE (A) NEGATIVE   pH 6.0 5.0 - 8.0   Protein, ur 100 (A) NEGATIVE mg/dL   Urobilinogen, UA 1.0 0.0 - 1.0 mg/dL   Nitrite NEGATIVE NEGATIVE   Leukocytes,Ua TRACE (A) NEGATIVE    ASSESSMENT & PLAN:  1. Loss of appetite   2. Decreased urine output   3. Muscle pain   Concern for AKI secondary to dehydration, and rhabdomyolysis.  Recommending further evaluation and management in the ED for loss of appetite, muscle aches, decreased urine output, and dark urine.  Patient aware and in agreement with this plan.  Declines transport via wheelchair.  Will go by private vehicle.    Rennis Harding, PA-C 11/10/18 1318

## 2018-11-10 NOTE — ED Notes (Signed)
Patient is unable to void at this time 

## 2018-11-10 NOTE — ED Notes (Signed)
Patient transported to X-ray 

## 2018-11-10 NOTE — Discharge Instructions (Signed)
Alternate tylenol and ibuprofen for fever.  Drink lots of fluids.

## 2018-11-10 NOTE — ED Provider Notes (Signed)
[facility] EMERGENCY DEPARTMENT Provider Note   CSN: [id number] Arrival date & time: 11/10/18  1409    History   Chief Complaint Chief Complaint  Patient presents with  . Nausea    HPI Jimmy Barr is a 28 y.o. male.     Pt presents to the ED today with cough, congestion, decreased appetite, and muscle aches.  The pt had 1 episode on n/v.  The pt initially went to urgent care who sent him here for IVFs.  The pt has been exposed to multiple people with the flu.  Sx have been going on since Friday, 2/14.     History reviewed. No pertinent past medical history.  There are no active problems to display for this patient.   History reviewed. No pertinent surgical history.      Home Medications    Prior to Admission medications   Medication Sig Start Date End Date Taking? Authorizing Provider  ibuprofen (ADVIL,MOTRIN) 600 MG tablet Take 1 tablet (600 mg total) by mouth every 6 (six) hours as needed. 11/10/18   Jacalyn Lefevre, MD  meclizine (ANTIVERT) 25 MG tablet Take 1 tablet (25 mg total) by mouth 3 (three) times daily as needed for dizziness. Patient not taking: Reported on 11/10/2018 02/06/17   Emi Holes, PA-C  ondansetron (ZOFRAN ODT) 4 MG disintegrating tablet Take 1 tablet (4 mg total) by mouth every 8 (eight) hours as needed. 11/10/18   Jacalyn Lefevre, MD  ondansetron (ZOFRAN) 4 MG tablet Take 1 tablet (4 mg total) by mouth every 6 (six) hours. Patient not taking: Reported on 11/10/2018 02/06/17   Emi Holes, PA-C  promethazine (PHENERGAN) 25 MG tablet Take 1 tablet (25 mg total) by mouth every 6 (six) hours as needed for nausea or vomiting. Patient not taking: Reported on 02/06/2017 08/07/13   Osie Cheeks    Family History History reviewed. No pertinent family history.  Social History Social History   Tobacco Use  . Smoking status: Current Some Day Smoker    Packs/day: 0.10  . Smokeless tobacco: Never Used    Substance Use Topics  . Alcohol use: Yes    Comment: social  . Drug use: Yes    Types: Marijuana     Allergies   Patient has no known allergies.   Review of Systems Review of Systems  Constitutional: Positive for appetite change, fatigue and fever.  HENT: Positive for congestion.   Respiratory: Positive for cough.   Gastrointestinal: Positive for nausea and vomiting.  All other systems reviewed and are negative.    Physical Exam Updated Vital Signs BP 104/74 (BP Location: Right Arm)   Pulse 91   Temp 99.7 F (37.6 C) (Oral)   Resp 18   SpO2 99%   Physical Exam Vitals signs and nursing note reviewed.  Constitutional:      Appearance: Normal appearance.  HENT:     Head: Normocephalic and atraumatic.     Right Ear: External ear normal.     Left Ear: External ear normal.     Nose: Nose normal.     Mouth/Throat:     Mouth: Mucous membranes are dry.  Eyes:     Extraocular Movements: Extraocular movements intact.     Conjunctiva/sclera: Conjunctivae normal.     Pupils: Pupils are equal, round, and reactive to light.  Neck:     Musculoskeletal: Normal range of motion and neck supple.  Cardiovascular:     Rate and Rhythm: Normal  rate and regular rhythm.  Pulmonary:     Effort: Pulmonary effort is normal.     Breath sounds: Normal breath sounds.  Abdominal:     General: Abdomen is flat. Bowel sounds are normal.     Palpations: Abdomen is soft.  Musculoskeletal: Normal range of motion.  Skin:    General: Skin is warm and dry.     Capillary Refill: Capillary refill takes less than 2 seconds.  Neurological:     General: No focal deficit present.     Mental Status: He is alert and oriented to person, place, and time.  Psychiatric:        Mood and Affect: Mood normal.        Behavior: Behavior normal.        Thought Content: Thought content normal.        Judgment: Judgment normal.      ED Treatments / Results  Labs (all labs ordered are listed, but only  abnormal results are displayed) Labs Reviewed  COMPREHENSIVE METABOLIC PANEL - Abnormal; Notable for the following components:      Result Value   Creatinine, Ser 1.40 (*)    Alkaline Phosphatase 36 (*)    All other components within normal limits  INFLUENZA PANEL BY PCR (TYPE A & B) - Abnormal; Notable for the following components:   Influenza B By PCR POSITIVE (*)    All other components within normal limits  LIPASE, BLOOD  CBC  CK  URINALYSIS, ROUTINE W REFLEX MICROSCOPIC    EKG None  Radiology Dg Chest 2 View  Result Date: 11/10/2018 CLINICAL DATA:  Fever cough. EXAM: CHEST - 2 VIEW COMPARISON:  01/14/2007 FINDINGS: The heart size and mediastinal contours are within normal limits. Both lungs are clear. The visualized skeletal structures are unremarkable. IMPRESSION: No active cardiopulmonary disease. Electronically Signed   By: Kennith Center M.D.   On: 11/10/2018 17:46    Procedures Procedures (including critical care time)  Medications Ordered in ED Medications  sodium chloride flush (NS) 0.9 % injection 3 mL (3 mLs Intravenous Not Given 11/10/18 1610)  sodium chloride 0.9 % bolus 1,000 mL (1,000 mLs Intravenous New Bag/Given 11/10/18 1554)  ondansetron (ZOFRAN) injection 4 mg (4 mg Intravenous Given 11/10/18 1554)  ketorolac (TORADOL) 30 MG/ML injection 30 mg (30 mg Intravenous Given 11/10/18 1554)     Initial Impression / Assessment and Plan / ED Course  I have reviewed the triage vital signs and the nursing notes.  Pertinent labs & imaging results that were available during my care of the patient were reviewed by me and considered in my medical decision making (see chart for details).       Pt is positive for the flu.  He is feeling better after IVFs.  He is out of the window for Tamiflu.  He will be d/c home with ibuprofen and zofran.  Return if worse.  Final Clinical Impressions(s) / ED Diagnoses   Final diagnoses:  Influenza B  Dehydration    ED Discharge  Orders         Ordered    ibuprofen (ADVIL,MOTRIN) 600 MG tablet  Every 6 hours PRN     11/10/18 1738    ondansetron (ZOFRAN ODT) 4 MG disintegrating tablet  Every 8 hours PRN     11/10/18 1739           Jacalyn Lefevre, MD 11/10/18 1800

## 2018-11-10 NOTE — ED Notes (Signed)
Patient able to ambulate independently  

## 2018-11-10 NOTE — Discharge Instructions (Signed)
Recommending further evaluation and management in the ED for loss of appetite, muscle aches, decreased urine output, and dark urine.  Patient aware and in agreement with this plan.  Declines transport via wheelchair.  Will go by private vehicle.

## 2019-06-25 ENCOUNTER — Emergency Department (HOSPITAL_COMMUNITY)
Admission: EM | Admit: 2019-06-25 | Discharge: 2019-06-26 | Disposition: A | Discharge status: Other Acute Inpatient Hospital | Payer: Managed Care, Other (non HMO) | Attending: Emergency Medicine | Admitting: Emergency Medicine

## 2019-06-25 ENCOUNTER — Encounter (HOSPITAL_COMMUNITY): Payer: Self-pay | Admitting: *Deleted

## 2019-06-25 ENCOUNTER — Other Ambulatory Visit: Payer: Self-pay

## 2019-06-25 DIAGNOSIS — F17200 Nicotine dependence, unspecified, uncomplicated: Secondary | ICD-10-CM | POA: Insufficient documentation

## 2019-06-25 DIAGNOSIS — Z20828 Contact with and (suspected) exposure to other viral communicable diseases: Secondary | ICD-10-CM | POA: Diagnosis not present

## 2019-06-25 DIAGNOSIS — R45851 Suicidal ideations: Secondary | ICD-10-CM | POA: Diagnosis not present

## 2019-06-25 DIAGNOSIS — F29 Unspecified psychosis not due to a substance or known physiological condition: Secondary | ICD-10-CM | POA: Diagnosis not present

## 2019-06-25 DIAGNOSIS — Z79899 Other long term (current) drug therapy: Secondary | ICD-10-CM | POA: Insufficient documentation

## 2019-06-25 LAB — CBC WITH DIFFERENTIAL/PLATELET
Abs Immature Granulocytes: 0.01 10*3/uL (ref 0.00–0.07)
Basophils Absolute: 0.1 10*3/uL (ref 0.0–0.1)
Basophils Relative: 1 %
Eosinophils Absolute: 0.1 10*3/uL (ref 0.0–0.5)
Eosinophils Relative: 1 %
HCT: 45.4 % (ref 39.0–52.0)
Hemoglobin: 14.1 g/dL (ref 13.0–17.0)
Immature Granulocytes: 0 %
Lymphocytes Relative: 35 %
Lymphs Abs: 2.6 10*3/uL (ref 0.7–4.0)
MCH: 26.4 pg (ref 26.0–34.0)
MCHC: 31.1 g/dL (ref 30.0–36.0)
MCV: 85 fL (ref 80.0–100.0)
Monocytes Absolute: 0.6 10*3/uL (ref 0.1–1.0)
Monocytes Relative: 9 %
Neutro Abs: 3.9 10*3/uL (ref 1.7–7.7)
Neutrophils Relative %: 54 %
Platelets: 280 10*3/uL (ref 150–400)
RBC: 5.34 MIL/uL (ref 4.22–5.81)
RDW: 14.4 % (ref 11.5–15.5)
WBC: 7.3 10*3/uL (ref 4.0–10.5)
nRBC: 0 % (ref 0.0–0.2)

## 2019-06-25 LAB — COMPREHENSIVE METABOLIC PANEL
ALT: 17 U/L (ref 0–44)
AST: 31 U/L (ref 15–41)
Albumin: 4.5 g/dL (ref 3.5–5.0)
Alkaline Phosphatase: 61 U/L (ref 38–126)
Anion gap: 8 (ref 5–15)
BUN: 16 mg/dL (ref 6–20)
CO2: 26 mmol/L (ref 22–32)
Calcium: 9.3 mg/dL (ref 8.9–10.3)
Chloride: 105 mmol/L (ref 98–111)
Creatinine, Ser: 1.18 mg/dL (ref 0.61–1.24)
GFR calc Af Amer: >60 mL/min (ref >60)
GFR calc non Af Amer: >60 mL/min (ref >60)
Glucose, Bld: 87 mg/dL (ref 70–99)
Potassium: 4 mmol/L (ref 3.5–5.1)
Sodium: 139 mmol/L (ref 135–145)
Total Bilirubin: 0.8 mg/dL (ref 0.3–1.2)
Total Protein: 7.8 g/dL (ref 6.5–8.1)

## 2019-06-25 LAB — ETHANOL: Alcohol, Ethyl (B): <10 mg/dL (ref <10)

## 2019-06-25 NOTE — ED Notes (Signed)
TTS machine placed in pt room  °

## 2019-06-25 NOTE — BH Assessment (Signed)
Tele Assessment Note   Patient Name: Jimmy Barr MRN: [id number] Referring Physician: Dr. Irene Pap Location of Patient: [facility] Location of Provider: [facility]   Patient is a 28 year old male.  Patient was brought to the ED by his friends.   Patient reports that he experiences auditory hallucinations since the age of 28 years old.  Patient denies taking medication management for his auditory hallucinations.  Patient denies command auditory hallucinations.  Patient denies prior outpatient therapy.    Patient reports trust issues and feeling paranoia.  Patient reports that his girlfriend slept with neighbor and the neighbor sent picture to him of them together.   Patient reports that he was angry and stated that he wanted to harm himself and his girlfriend.  However, patient stated that, "he was not serious about harming anyone, he was just angry".     Patient reports that he is breaking up with his girlfriend and she is in the process of moving out.  Patient reports that he has lived with his girlfriend for a year and a half and they have been together for 2 years.     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      Diagnosis: MDD, Single episode  Past Medical History: History reviewed. No pertinent past medical history.  History reviewed. No pertinent surgical history.  Family History: No family history on file.  Social History:  reports that he has been smoking. He has been smoking about 0.10 packs per day. He has never used smokeless tobacco. He reports current alcohol use. He reports current drug use. Drug: Marijuana.  Additional Social History:  Alcohol / Drug Use History of alcohol / drug use?: No history of alcohol / drug abuse  CIWA: CIWA-Ar BP: 133/89 Pulse Rate: 77 COWS:    Allergies: No Known Allergies  Home Medications: (Not in a hospital admission)   OB/GYN Status:  No LMP for male  patient.  General Assessment Data Location of Assessment: [facility] ED TTS Assessment: In system Is this a Tele or Face-to-Face Assessment?: Tele Assessment Is this an Initial Assessment or a Re-assessment for this encounter?: Initial Assessment Patient Accompanied by:: Other Language Other than English: No Living Arrangements: Other (Comment) What gender do you identify as?: Male Marital status: Single Maiden name: (NA) Pregnancy Status: No Living Arrangements: (Lives wtih his girlfriend) Can pt return to current living arrangement?: Yes Admission Status: Voluntary Is patient capable of signing voluntary admission?: Yes Referral Source: Self/Family/Friend Insurance type: Psychologist, counselling)     [place] Living Arrangements: (Lives wtih his girlfriend)  Education Status Is patient currently in school?: No Is the patient employed, unemployed or receiving disability?: Employed  Risk to self with the past 6 months Suicidal Ideation: No Has patient been a risk to self within the past 6 months prior to admission? : No Suicidal Intent: No Has patient had any suicidal intent within the past 6 months prior to admission? : No Is patient at risk for suicide?: No Suicidal Plan?: No Has patient had any suicidal plan within the past 6 months prior to admission? : No Access to Means: No What has been your use of drugs/alcohol within the last 12 months?: None Reported Previous Attempts/Gestures: No How many times?: 0 Other Self Harm Risks: NA Triggers for Past Attempts: None known Intentional Self Injurious Behavior: None Family Suicide History: No Recent stressful life event(s): (Girlfriend cheated on his with his neighbor.)  Persecutory voices/beliefs?: Yes Depression: Yes Depression Symptoms: Despondent, Insomnia, Tearfulness, Isolating, Fatigue, Guilt, Loss of interest in usual pleasures, Feeling worthless/self pity, Feeling angry/irritable Substance abuse history and/or treatment for  substance abuse?: No Suicide prevention information given to non-admitted patients: Yes  Risk to Others within the past 6 months Homicidal Ideation: No Does patient have any lifetime risk of violence toward others beyond the six months prior to admission? : No Thoughts of Harm to Others: No Current Homicidal Intent: No Current Homicidal Plan: No Access to Homicidal Means: No Identified Victim: NA History of harm to others?: No Assessment of Violence: None Noted Violent Behavior Description: NA Does patient have access to weapons?: No Criminal Charges Pending?: No Does patient have a court date: No Is patient on probation?: No  Psychosis Hallucinations: Auditory Delusions: Grandiose  Mental Status Report Appearance/Hygiene: Disheveled Eye Contact: Good Motor Activity: Freedom of movement Speech: Logical/coherent Level of Consciousness: Alert Mood: Suspicious Affect: Blunted, Depressed Anxiety Level: Minimal Thought Processes: Coherent, Relevant Judgement: Unimpaired Orientation: Person, Place, Time, Situation Obsessive Compulsive Thoughts/Behaviors: None  Cognitive Functioning Concentration: Decreased Memory: Recent Intact, Remote Intact Is patient IDD: No Insight: Fair Impulse Control: Fair Appetite: Fair Have you had any weight changes? : No Change Sleep: Decreased Total Hours of Sleep: 5 Vegetative Symptoms: Staying in bed  ADLScreening [facility] Assessment Services) Patient's cognitive ability adequate to safely complete daily activities?: Yes Patient able to express need for assistance with ADLs?: Yes Independently performs ADLs?: Yes (appropriate for developmental age)  Prior Inpatient Therapy Prior Inpatient Therapy: No  Prior Outpatient Therapy Prior Outpatient Therapy: No Does patient have an ACCT team?: No Does patient have Intensive In-House Services?  : No Does patient have [facility] services? : No Does patient have P4CC services?: No  ADL Screening  (condition at time of admission) Patient's cognitive ability adequate to safely complete daily activities?: Yes Is the patient deaf or have difficulty hearing?: No Does the patient have difficulty seeing, even when wearing glasses/contacts?: No Does the patient have difficulty concentrating, remembering, or making decisions?: No Patient able to express need for assistance with ADLs?: Yes Does the patient have difficulty dressing or bathing?: No Independently performs ADLs?: Yes (appropriate for developmental age) Does the patient have difficulty walking or climbing stairs?: No Weakness of Legs: None Weakness of Arms/Hands: None  Home Assistive Devices/Equipment Home Assistive Devices/Equipment: None      Values / Beliefs Cultural Requests During Hospitalization: None Spiritual Requests During Hospitalization: None Consults Spiritual Care Consult Needed: No Social Work Consult Needed: No Merchant navy officer (For Healthcare) Does Patient Have a Medical Advance Directive?: No Would patient like information on creating a medical advance directive?: No - Patient declined          Disposition: Accepted to OBS, per Barbara Cower, NP Disposition Initial Assessment Completed for this Encounter: Yes  This service was provided via telemedicine using a 2-way, interactive audio and video technology.  Names of all persons participating in this telemedicine service and their role in this encounter. Name: Phillip Heal Role: MA, LCAS-A  Name: Deirdre Priest  Role: Patient           Phillip Heal [place] 06/26/2019 12:00 AM

## 2019-06-25 NOTE — ED Triage Notes (Signed)
Pt admits to trust issues. Girlfriend slept with neighbor, neighbor sent picture to pt of them together. Yesterday pt had the feeling he wanted to harm his(now) ex girlfriend but did not harm her. He had a feeling of wanting to harm himself about a week ago. His friends encouraged him to come in for help

## 2019-06-25 NOTE — ED Provider Notes (Addendum)
  Physical Exam  BP 133/89 (BP Location: Left Arm)   Pulse 77   Temp 98.8 F (37.1 C) (Oral)   Resp 18   SpO2 100%   Physical Exam  ED Course/Procedures     Procedures  MDM  Patient care assumed from [facility], PA-C, please see her note for full HPI.  Briefly, patient here with a long history of trust issues, he will not expand on his issues.  He reports he has had suicidal thoughts but denies having a plan, does not have any SI currently.  He also endorses some HI, reports he wanted to kill his girlfriend, but denies so today.  Plan is to follow-up on labs along with TTS consult.  CBC was unremarkable, hemoglobin is normal. CMP without electrolyte derangement.  Kidney function is within normal limits.  LFTs are unremarkable.  Ethanol level is less than 10.UDS is currently pending. Per prior provider patient did not have any medical complaints will place call for TTS consult.   12:18 AM per [facility], patient meets criteria for an observation after COVID test returned negative.  COVID-19 test placed.    Portions of this note were generated with Lobbyist. Dictation errors may occur despite best attempts at proofreading.     Janeece Fitting, PA-C 06/26/19 0018    Janeece Fitting, PA-C 06/26/19 0019    Blanchie Dessert, MD 06/30/19 2241

## 2019-06-25 NOTE — ED Notes (Signed)
Pt came out of triage room and asked to go to his car. He was advised that doing so could delay treatment and/or an EDP evaluating the pt. Pt went back to his room.

## 2019-06-25 NOTE — ED Provider Notes (Signed)
[facility]-EMERGENCY DEPT Provider Note   CSN: [id number] Arrival date & time: 06/25/19  1839     History   Chief Complaint Chief Complaint  Patient presents with  . Medical Clearance    HPI Aldwin E Mckone is a 28 y.o. male.     HPI  28 year old male presents to the emergency department per patient because his friends asked him to.  Patient states that he has a long history of trust issues.  He states for many years he has been paranoid and worried that everyone is going to like to him.  He denies any hallucinations.  He states that recently he has had this feeling with his girlfriend that she was cheating on him.  He cut his left wrist because "I needed to remind myself" however he will not expand on this.  He notes that he has had suicidal thoughts but denies a plan and denies any suicidal thoughts currently.  He also states a history of homicidal ideations to kill his girlfriend but denies this currently.  He does admit to having anger issues.  He denies any medical complaints.  History reviewed. No pertinent past medical history.  There are no active problems to display for this patient.   History reviewed. No pertinent surgical history.      Home Medications    Prior to Admission medications   Medication Sig Start Date End Date Taking? Authorizing Provider  ibuprofen (ADVIL,MOTRIN) 600 MG tablet Take 1 tablet (600 mg total) by mouth every 6 (six) hours as needed. 11/10/18   Jacalyn Lefevre, MD  meclizine (ANTIVERT) 25 MG tablet Take 1 tablet (25 mg total) by mouth 3 (three) times daily as needed for dizziness. Patient not taking: Reported on 11/10/2018 02/06/17   Emi Holes, PA-C  ondansetron (ZOFRAN ODT) 4 MG disintegrating tablet Take 1 tablet (4 mg total) by mouth every 8 (eight) hours as needed. 11/10/18   Jacalyn Lefevre, MD  ondansetron (ZOFRAN) 4 MG tablet Take 1 tablet (4 mg total) by mouth every 6 (six) hours. Patient not taking:  Reported on 11/10/2018 02/06/17   Emi Holes, PA-C  promethazine (PHENERGAN) 25 MG tablet Take 1 tablet (25 mg total) by mouth every 6 (six) hours as needed for nausea or vomiting. Patient not taking: Reported on 02/06/2017 08/07/13   Osie Cheeks    Family History No family history on file.  Social History Social History   Tobacco Use  . Smoking status: Current Some Day Smoker    Packs/day: 0.10  . Smokeless tobacco: Never Used  Substance Use Topics  . Alcohol use: Yes    Comment: social  . Drug use: Yes    Types: Marijuana     Allergies   Patient has no known allergies.   Review of Systems Review of Systems  Constitutional: Negative for chills and fever.  HENT: Negative for rhinorrhea and sore throat.   Eyes: Negative for visual disturbance.  Respiratory: Negative for cough and shortness of breath.   Cardiovascular: Negative for chest pain and leg swelling.  Gastrointestinal: Negative for abdominal pain, diarrhea, nausea and vomiting.  Genitourinary: Negative for dysuria, frequency and urgency.  Musculoskeletal: Negative for joint swelling and neck pain.  Skin: Negative for rash and wound.  Neurological: Negative for syncope and numbness.  Psychiatric/Behavioral: Positive for agitation and self-injury. Negative for hallucinations.  All other systems reviewed and are negative.    Physical Exam Updated Vital Signs BP 133/89 (BP Location: Left  Arm)   Pulse 77   Temp 98.8 F (37.1 C) (Oral)   Resp 18   SpO2 100%   Physical Exam Vitals signs and nursing note reviewed.  Constitutional:      Appearance: He is well-developed.  HENT:     Head: Normocephalic and atraumatic.  Eyes:     Conjunctiva/sclera: Conjunctivae normal.  Neck:     Musculoskeletal: Neck supple.  Cardiovascular:     Rate and Rhythm: Normal rate and regular rhythm.     Heart sounds: Normal heart sounds. No murmur.  Pulmonary:     Effort: Pulmonary effort is normal. No  respiratory distress.     Breath sounds: Normal breath sounds. No wheezing or rales.  Abdominal:     General: Bowel sounds are normal. There is no distension.     Palpations: Abdomen is soft.     Tenderness: There is no abdominal tenderness.  Musculoskeletal: Normal range of motion.        General: No tenderness or deformity.  Skin:    General: Skin is warm and dry.     Findings: No erythema or rash.       Neurological:     Mental Status: He is alert and oriented to person, place, and time.  Psychiatric:        Attention and Perception: Attention normal.        Mood and Affect: Mood normal.        Speech: Speech normal.        Behavior: Behavior normal. Behavior is cooperative.      ED Treatments / Results  Labs (all labs ordered are listed, but only abnormal results are displayed) Labs Reviewed  COMPREHENSIVE METABOLIC PANEL  ETHANOL  RAPID URINE DRUG SCREEN, HOSP PERFORMED  CBC WITH DIFFERENTIAL/PLATELET    EKG None  Radiology No results found.  Procedures Procedures (including critical care time)  Medications Ordered in ED Medications - No data to display   Initial Impression / Assessment and Plan / ED Course  I have reviewed the triage vital signs and the nursing notes.  Pertinent labs & imaging results that were available during my care of the patient were reviewed by me and considered in my medical decision making (see chart for details).        Patient presents with complaints of feeling paranoid.  He does have marks on his left wrist from where he cut himself.  He currently denies any SI or HI but states he has had these feelings within the last week.  Due to his complex situation, I believe he would benefit from a psychiatric evaluation.  Discussed this with the patient and he is agreeable with evaluation.  His blood work and urine are pending.  Signout given to PA Janeece Fitting who will follow-up on results to medically clear the patient for psychiatric  evaluation.  Final Clinical Impressions(s) / ED Diagnoses   Final diagnoses:  None    ED Discharge Orders    None       Rachel Moulds 06/25/19 2058    Julianne Rice, MD 06/29/19 2116

## 2019-06-26 ENCOUNTER — Observation Stay (HOSPITAL_COMMUNITY)
Admission: AD | Admit: 2019-06-26 | Discharge: 2019-06-26 | Disposition: A | Discharge status: Home / Self Care | Payer: 59 | Source: Intra-hospital | Attending: Psychiatry | Admitting: Psychiatry

## 2019-06-26 ENCOUNTER — Other Ambulatory Visit: Payer: Self-pay

## 2019-06-26 ENCOUNTER — Encounter (HOSPITAL_COMMUNITY): Payer: Self-pay | Admitting: Emergency Medicine

## 2019-06-26 DIAGNOSIS — F22 Delusional disorders: Secondary | ICD-10-CM | POA: Insufficient documentation

## 2019-06-26 DIAGNOSIS — F17210 Nicotine dependence, cigarettes, uncomplicated: Secondary | ICD-10-CM | POA: Insufficient documentation

## 2019-06-26 DIAGNOSIS — R45850 Homicidal ideations: Secondary | ICD-10-CM | POA: Diagnosis present

## 2019-06-26 DIAGNOSIS — R45851 Suicidal ideations: Secondary | ICD-10-CM | POA: Insufficient documentation

## 2019-06-26 DIAGNOSIS — F4323 Adjustment disorder with mixed anxiety and depressed mood: Principal | ICD-10-CM | POA: Insufficient documentation

## 2019-06-26 DIAGNOSIS — Z79899 Other long term (current) drug therapy: Secondary | ICD-10-CM | POA: Diagnosis not present

## 2019-06-26 DIAGNOSIS — F419 Anxiety disorder, unspecified: Secondary | ICD-10-CM | POA: Diagnosis not present

## 2019-06-26 LAB — RAPID URINE DRUG SCREEN, HOSP PERFORMED
Amphetamines: NOT DETECTED
Barbiturates: NOT DETECTED
Benzodiazepines: NOT DETECTED
Cocaine: NOT DETECTED
Opiates: NOT DETECTED
Tetrahydrocannabinol: POSITIVE — AB

## 2019-06-26 LAB — SARS CORONAVIRUS 2 BY RT PCR (HOSPITAL ORDER, PERFORMED IN ~~LOC~~ HOSPITAL LAB): SARS Coronavirus 2: NEGATIVE

## 2019-06-26 MED ORDER — ACETAMINOPHEN 325 MG PO TABS: 650.0000 mg | ORAL_TABLET | Freq: Four times a day (QID) | ORAL | Status: DC | PRN

## 2019-06-26 MED ORDER — HYDROXYZINE HCL 25 MG PO TABS: 25.0000 mg | ORAL_TABLET | Freq: Three times a day (TID) | ORAL | 0 refills | Status: DC | PRN

## 2019-06-26 MED ORDER — ALUM & MAG HYDROXIDE-SIMETH 200-200-20 MG/5ML PO SUSP: 30.0000 mL | ORAL | Status: DC | PRN

## 2019-06-26 MED ORDER — HYDROXYZINE HCL 25 MG PO TABS: 25.0000 mg | ORAL_TABLET | Freq: Three times a day (TID) | ORAL | Status: DC | PRN

## 2019-06-26 MED ORDER — MAGNESIUM HYDROXIDE 400 MG/5ML PO SUSP: 30.0000 mL | Freq: Every day | ORAL | Status: DC | PRN

## 2019-06-26 NOTE — Progress Notes (Signed)
Patient ID: Jimmy Barr, male   DOB: 08-29-1991, 28 y.o.   MRN: [id number] Pt A&O x 4, cooperative and slightly anxious, presents with complaint of passive SI.  Pt reports he was angry after break up with girlfriend.  Pt reports his girlfriend slept with his neighbor and neighbor sent a picture of the two together.  Denies HI but admits to auditory hallucinations and paranoia, hearing voices of people calling his name.  Skin search completed.  Monitoring for safety.

## 2019-06-26 NOTE — Plan of Care (Signed)
[facility] Crisis Plan  Reason for Crisis Plan:  Crisis Stabilization   Plan of Care:  Referral for Inpatient Hospitalization  Family Support:   yes   Current Living Environment:   Lives in apartment  Insurance:   Hospital Account    Name Acct ID Class Status Primary Coverage   Ronald, Londo [id number] [place]        Guarantor Account (for Hospital Account [id number])    Name Relation to Pt Service Area Active? Acct Type   [name] Self [facility] Yes [facility]   Address Phone       [address] [place], [place] [address] [phone](H)          Coverage Information (for Hospital Account [id number])    F/O Payor/Plan Precert #   [place] #   [place], [place] [id number]   Address Phone   [address] [place] [address] [phone]      Legal Guardian:     Primary Care Provider:  Patient, No Pcp Per  Current Outpatient Providers:  Corena Pilgrim, MD  Psychiatrist:     Counselor/Therapist:     Compliant with Medications:  No  Additional Information:   [name] 10/4/20204:40 AM

## 2019-06-26 NOTE — Discharge Summary (Addendum)
Physician Discharge Summary Note  Patient:  Jimmy Barr is an 28 y.o., male MRN:  [id number] DOB:  Jul 22, 1991 Patient phone:  [phone] (home)  Patient address:   [address] [place] [address],  Total Time spent with patient: 30 minutes  Date of Admission:  06/26/2019 Date of Discharge: 06/26/2019  Reason for Admission:  Homicidal ideations Principal Problem: Adjustment disorder with mixed anxiety and depressed mood Discharge Diagnoses: Principal Problem:   Adjustment disorder with mixed anxiety and depressed mood  Past Psychiatric History: unremarkable  Past Medical History: History reviewed. No pertinent past medical history. History reviewed. No pertinent surgical history. Family History: History reviewed. No pertinent family history. Family Psychiatric  History: unknown Social History:  Social History   Substance and Sexual Activity  Alcohol Use Yes   Comment: social     Social History   Substance and Sexual Activity  Drug Use Yes  . Types: Marijuana    Social History   Socioeconomic History  . Marital status: Single    Spouse name: Not on file  . Number of children: Not on file  . Years of education: Not on file  . Highest education level: Not on file  Occupational History  . Not on file  Social Needs  . Financial resource strain: Not on file  . Food insecurity    Worry: Not on file    Inability: Not on file  . Transportation needs    Medical: Not on file    Non-medical: Not on file  Tobacco Use  . Smoking status: Current Some Day Smoker    Packs/day: 0.10  . Smokeless tobacco: Never Used  Substance and Sexual Activity  . Alcohol use: Yes    Comment: social  . Drug use: Yes    Types: Marijuana  . Sexual activity: Yes  Lifestyle  . Physical activity    Days per week: Not on file    Minutes per session: Not on file  . Stress: Not on file  Relationships  . Social Herbalist on phone: Not on file    Gets  together: Not on file    Attends religious service: Not on file    Active member of club or organization: Not on file    Attends meetings of clubs or organizations: Not on file    Relationship status: Not on file  Other Topics Concern  . Not on file  Social History Narrative  . Not on file    Hospital Course:  Per EDP admission assessment:  28 year old male presents to the emergency department per patient because his friends asked him to.  Patient states that he has a long history of trust issues.  He states for many years he has been paranoid and worried that everyone is going to like to him.  He denies any hallucinations.  He states that recently he has had this feeling with his girlfriend that she was cheating on him.  He cut his left wrist because "I needed to remind myself" however he will not expand on this.  He notes that he has had suicidal thoughts but denies a plan and denies any suicidal thoughts currently.  He also states a history of homicidal ideations to kill his girlfriend but denies this currently.  He does admit to having anger issues.  He denies any medical complaints.  June 26, 2019:  Chart reviewed and the patient seen during mental health clinician rounds. On evaluation [name] reports yesterday he was feeling sad after learning his long term girlfriend of 2 years was unfaithful.  He reports becoming upset after someone showed him pictures of his girlfriend and a neighbor together.  He states he said some, 'not so good things" in the heat of the moment but today, states he has had time to cool off and feels he is in a better place.  He states he is sad over his girlfriend's infidelity but has made plans for her to leave their shared residence, of which he is the primary custodian.  He denies a history for mental illness and denies inpatient hospitalization for psychiatric concerns.  He endorses occasionally use of  Marijuana and alcohol but denies other street drugs.   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  He is from the local area and endorses familial support. He would like to be discharged home with resources for community mental health services for counseling.   During evaluation Wayman E Derrell Lollingngram is standing beside the be. He is alert/oriented x 4; calm/cooperative; and mood congruent with affect.  Patient is speaking in a clear tone at moderate volume, and normal pace; with good eye contact.  His thought process is coherent and relevant; There is no indication that he is currently responding to internal/external stimuli or experiencing delusional thought content.  Patient denies suicidal/self-harm/homicidal ideation, psychosis, and paranoia.  Patient has remained calm throughout assessment and has answered questions appropriately.   Physical Findings: AIMS: Facial and Oral Movements Muscles of Facial Expression: None, normal Lips and Perioral Area: None, normal Jaw: None, normal Tongue: None, normal,Extremity Movements Upper (arms, wrists, hands, fingers): None, normal Lower (legs, knees, ankles, toes): None, normal, Trunk Movements Neck, shoulders, hips: None, normal, Overall Severity Severity of abnormal movements (highest score from questions above): None, normal Incapacitation due to abnormal movements: None, normal Patient's awareness of abnormal movements (rate only patient's report): No Awareness, Dental Status Current problems with teeth and/or dentures?: No Does patient usually wear dentures?: No  CIWA:  CIWA-Ar Total: 1 COWS:  COWS Total Score: 1  Musculoskeletal: Strength & Muscle Tone: within normal limits Gait & Station: normal Patient leans: N/A  Psychiatric Specialty Exam: Physical Exam  Constitutional: He is oriented to person, place, and time. He appears well-developed.  Neck: Normal range of motion.  Respiratory: Effort normal.   Musculoskeletal: Normal range of motion.  Neurological: He is alert and oriented to person, place, and time.  Psychiatric: He has a normal mood and affect. His behavior is normal. Judgment and thought content normal.    ROS  Blood pressure (!) 131/99, pulse 70, temperature 98.8 F (37.1 C), resp. rate 18.There is no height or weight on file to calculate BMI.  General Appearance: Negative, Casual and Neat  Eye Contact:  Good  Speech:  Clear and Coherent  Volume:  Normal  Mood:  Sad  Affect:  Congruent  Thought Process:  Coherent  Orientation:  Full (Time, Place, and Person)  Thought Content:  Logical  Suicidal Thoughts:  No  Homicidal Thoughts:  No  Memory:  Immediate;   Good Recent;   Good Remote;   Good  Judgement:  Good  Insight:  Good  Psychomotor Activity:  Normal  Concentration:  Concentration: Good and Attention Span: Good  Recall:  Good  Fund of Knowledge:  Good  Language:  Good  Akathisia:  NA  Handed:  Right  AIMS (if indicated):     Assets:  Communication Skills Desire for Improvement Financial Resources/Insurance Housing Resilience Social Support  ADL's:  Intact  Cognition:  WNL  Sleep:   > 6 hours        Has this patient used any form of tobacco in the last 30 days? (Cigarettes, Smokeless Tobacco, Cigars, and/or Pipes) Yes, Yes, Prescription not provided because: the patient declined  Blood Alcohol level:  Lab Results  Component Value Date   ETH <10 06/25/2019    Metabolic Disorder Labs:  No results found for: HGBA1C, MPG No results found for: PROLACTIN No results found for: CHOL, TRIG, HDL, CHOLHDL, VLDL, LDLCALC  See Psychiatric Specialty Exam and Suicide Risk Assessment completed by Attending Physician prior to discharge.  Discharge destination:  Home  Is patient on multiple antipsychotic therapies at discharge:  No   Has Patient had three or more failed trials of antipsychotic monotherapy by history:  No  Recommended Plan for Multiple  Antipsychotic Therapies: NA  Discharge Instructions    Diet - low sodium heart healthy   Complete by: As directed    Increase activity slowly   Complete by: As directed      Allergies as of 06/26/2019   No Known Allergies     Medication List    STOP taking these medications   ibuprofen 200 MG tablet Commonly known as: ADVIL   ibuprofen 600 MG tablet Commonly known as: ADVIL   meclizine 25 MG tablet Commonly known as: ANTIVERT   ondansetron 4 MG disintegrating tablet Commonly known as: Zofran ODT   ondansetron 4 MG tablet Commonly known as: ZOFRAN   pediatric multivitamin chewable tablet   promethazine 25 MG tablet Commonly known as: PHENERGAN     TAKE these medications     Indication  hydrOXYzine 25 MG tablet Commonly known as: ATARAX/VISTARIL Take 1 tablet (25 mg total) by mouth 3 (three) times daily as needed for anxiety.  Indication: Feeling Anxious        Follow-up recommendations:  Activity:  as tolerated Diet:  as tolerated Other:  with community mental provider per recommendations   I am sending your home with a 30 days supply of hydroxyzine to help with anxiety.  You can take this medication up to three times daily as needed.  This medication can cause drowsiness so do not take it when engaging in activities that require alertness such as driving or lifting heavy machinery/equipment.   Comments:   Discharge home.  The patient appears reasonably screened and/or stabilized for discharge and does not appear to have emergency medical/psychiatric concerns/conditions requiring further screening, evaluation, or treatment at this time prior to discharge.     Take all of you medications as prescribed by your mental healthcare provider.  Report any adverse effects and reactions from your medications to your outpatient provider promptly.  Do not engage in alcohol and or illegal drug use while on prescription medicines. Keep all scheduled appointments. This is  to ensure that you are getting refills on time and to avoid any interruption in your medication.  If you are unable to keep an appointment call to reschedule.  Be sure to follow up with resources and follow ups given. In the event of worsening symptoms call the crisis hotline, 911, and or go to the nearest emergency department for appropriate evaluation and treatment of symptoms. Follow-up with your primary care provider for your medical issues, concerns and or health care needs.  Signed: Chales Abrahams, NP 06/26/2019, 2:07 PM  Patient seen face-to-face for psychiatric evaluation, chart reviewed and case discussed with the physician extender and developed treatment plan. Reviewed the information documented and agree with the treatment plan. Thedore Mins, MD

## 2019-06-26 NOTE — Progress Notes (Signed)
Patient and educated about follow up care, upcoming appointments reviewed. Patient verbalizes understanding of all follow up appointments. AVS and suicide safety plan reviewed. Patient expresses no concerns or questions at this time. Educated on prescriptions and medication regimen. Patient belongings returned. Patient denies SI, HI, AVH at this time. Educated patient about suicide help resources and hotline, encouraged to call for assistance in the event of a crisis. Patient agrees. Patient is ambulatory and safe at time of discharge. Patient discharged to hospital lobby at this time.

## 2019-06-26 NOTE — BH Assessment (Signed)
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

## 2019-06-26 NOTE — H&P (Signed)
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  Patient was brought to the ED by his friends. Patient reports that he experiences auditory hallucinations since the age of 28 years old.  Patient denies taking medication management for his auditory hallucinations.  Patient denies command auditory hallucinations.  Patient denies prior outpatient therapy.  Patient reports trust issues and feeling paranoia.  Patient reports that his girlfriend slept with neighbor and the neighbor sent picture to him of them together. Patient reports that he was angry and stated that he wanted to harm himself and his girlfriend.  However, patient stated that, "he was not serious about harming anyone, he was just angry".  Patient reports that he is breaking up with his girlfriend and she is in the process of moving out.  Patient reports that he has lived with his girlfriend for a year and a half and they have been together for 2 years.  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   Evaluation on Unit: Reviewed TTS assessment and validated with patient. On evaluation patient is alert and oriented x 4, pleasant, and cooperative. Speech is clear and coherent. Mood is depressed and anxious. Affect is congruent with mood. Thought process is coherent and thought content is logical. Reports that he has a lot of anxiety and has a difficult time trusting people. Denies suicidal ideations. Denies homicidal ideations. Denies substance abuse. Reports auditory hallucinations since the age of 72  but would not discuss further.  No indication that patient is responding to internal stimuli.     Associated Signs/Symptoms: Depression Symptoms:  depressed mood, insomnia, psychomotor agitation, difficulty concentrating, suicidal thoughts without plan, anxiety, (Hypo) Manic Symptoms:  Distractibility, Hallucinations, Anxiety Symptoms:  Excessive Worry, Psychotic Symptoms:  Hallucinations: Auditory Paranoia, PTSD Symptoms: Negative Total Time spent with patient: 20 minutes  Past Psychiatric History: Anxiety, Reports history of auditory hallucinations, states that he has never received treatment.  Is the patient at risk to self? Yes.    Has the patient been a risk to self in the past 6 months? No.  Has the patient been a risk to self within the distant past? No.  Is the patient a risk to others? Yes.    Has the patient been a risk to others in the past 6 months? No.  Has the patient been a risk to others within the distant past? No.   Prior Inpatient Therapy:   Prior Outpatient Therapy:    Alcohol Screening:   Substance Abuse History in the last 12 months:  No. Consequences of Substance Abuse: NA Previous Psychotropic Medications: No  Psychological Evaluations: No  Past Medical History: No past medical history on file. No past surgical history on file. Family History: No family history on file. Family Psychiatric History: no pertinent history Tobacco Screening:   Social History:  Social History   Substance and Sexual Activity  Alcohol Use Yes   Comment: social     Social History   Substance and Sexual Activity  Drug Use Yes  . Types: Marijuana    Additional Social History:  Allergies:  No Known Allergies Lab Results:  Results for orders placed or performed during the hospital encounter of 06/25/19 (from the past 48 hour(s))  Comprehensive metabolic panel     Status: None   Collection Time: 06/25/19  9:13 PM  Result  Value Ref Range   Sodium 139 135 - 145 mmol/L   Potassium 4.0 3.5 - 5.1 mmol/L   Chloride 105 98 - 111 mmol/L   CO2 26 22 - 32 mmol/L   Glucose, Bld 87 70 - 99 mg/dL   BUN 16 6 - 20 mg/dL   Creatinine, Ser 1.18 0.61 - 1.24 mg/dL   Calcium 9.3 8.9 - 10.3 mg/dL   Total Protein 7.8 6.5 - 8.1 g/dL   Albumin 4.5 3.5 - 5.0 g/dL   AST 31 15 - 41 U/L   ALT 17 0 - 44 U/L   Alkaline Phosphatase 61 38 - 126 U/L   Total Bilirubin 0.8 0.3 - 1.2 mg/dL   GFR calc non Af Amer >60 >60 mL/min   GFR calc Af Amer >60 >60 mL/min   Anion gap 8 5 - 15    Comment: Performed at [facility], [place] [address], [place], [place] [address]  Ethanol     Status: None   Collection Time: 06/25/19  9:13 PM  Result Value Ref Range   Alcohol, Ethyl (B) <10 <10 mg/dL    Comment: (NOTE) Lowest detectable limit for serum alcohol is 10 mg/dL. For medical purposes only. Performed at [facility], [place] [address], [place], [place] [address]   CBC with Diff     Status: None   Collection Time: 06/25/19  9:13 PM  Result Value Ref Range   WBC 7.3 4.0 - 10.5 K/uL   RBC 5.34 4.22 - 5.81 MIL/uL   Hemoglobin 14.1 13.0 - 17.0 g/dL   HCT 45.4 39.0 - 52.0 %   MCV 85.0 80.0 - 100.0 fL   MCH 26.4 26.0 - 34.0 pg   MCHC 31.1 30.0 - 36.0 g/dL   RDW 14.4 11.5 - 15.5 %   Platelets 280 150 - 400 K/uL   nRBC 0.0 0.0 - 0.2 %   Neutrophils Relative % 54 %   Neutro Abs 3.9 1.7 - 7.7 K/uL   Lymphocytes Relative 35 %   Lymphs Abs 2.6 0.7 - 4.0 K/uL   Monocytes Relative 9 %   Monocytes Absolute 0.6 0.1 - 1.0 K/uL   Eosinophils Relative 1 %   Eosinophils Absolute 0.1 0.0 - 0.5 K/uL   Basophils Relative 1 %   Basophils Absolute 0.1 0.0 - 0.1 K/uL   Immature Granulocytes 0 %   Abs Immature Granulocytes 0.01 0.00 - 0.07 K/uL    Comment: Performed at [facility], [place] [address] [place], [place] [address]  SARS Coronavirus 2 [facility] order, Performed in [facility] hospital lab)  Nasopharyngeal Nasopharyngeal Swab     Status: None   Collection Time: 06/26/19 12:12 AM   Specimen: Nasopharyngeal Swab  Result Value Ref Range   SARS Coronavirus 2 NEGATIVE NEGATIVE    Comment: (NOTE) If result is NEGATIVE SARS-CoV-2 target nucleic acids are NOT DETECTED. The SARS-CoV-2 RNA is generally detectable in upper and lower  respiratory specimens during the acute phase of infection. The lowest  concentration of SARS-CoV-2 viral copies this assay can detect is 250  copies / mL. A negative result does not preclude SARS-CoV-2 infection  and should not be used as the sole basis  for treatment or other  patient management decisions.  A negative result may occur with  improper specimen collection / handling, submission of specimen other  than nasopharyngeal swab, presence of viral mutation(s) within the  areas targeted by this assay, and inadequate number of viral copies  (<250 copies / mL). A negative result must be combined with clinical  observations, patient history, and epidemiological information. If result is POSITIVE SARS-CoV-2 target nucleic acids are DETECTED. The SARS-CoV-2 RNA is generally detectable in upper and lower  respiratory specimens dur ing the acute phase of infection.  Positive  results are indicative of active infection with SARS-CoV-2.  Clinical  correlation with patient history and other diagnostic information is  necessary to determine patient infection status.  Positive results do  not rule out bacterial infection or co-infection with other viruses. If result is PRESUMPTIVE POSTIVE SARS-CoV-2 nucleic acids MAY BE PRESENT.   A presumptive positive result was obtained on the submitted specimen  and confirmed on repeat testing.  While 2019 novel coronavirus  (SARS-CoV-2) nucleic acids may be present in the submitted sample  additional confirmatory testing may be necessary for epidemiological  and / or clinical management purposes  to differentiate  between  SARS-CoV-2 and other Sarbecovirus currently known to infect humans.  If clinically indicated additional testing with an alternate test  methodology [phone]([id number]) is advised. The SARS-CoV-2 RNA is generally  detectable in upper and lower respiratory sp ecimens during the acute  phase of infection. The expected result is Negative. Fact Sheet for Patients:  BoilerBrush.com.cyhttps://www.fda.gov/media/136312/download Fact Sheet for Healthcare Providers: https://pope.com/https://www.fda.gov/media/136313/download This test is not yet approved or cleared by the [place] FDA and has been authorized for detection and/or diagnosis of SARS-CoV-2 by FDA under an Emergency Use Authorization (EUA).  This EUA will remain in effect (meaning this test can be used) for the duration of the COVID-19 declaration under Section 564(b)(1) of the Act, 21 U.S.C. section 360bbb-3(b)(1), unless the authorization is terminated or revoked sooner. Performed at [facility], [address] [place], [place] [address]     Blood Alcohol level:  Lab Results  Component Value Date   ETH <10 06/25/2019    Metabolic Disorder Labs:  No results found for: HGBA1C, MPG No results found for: PROLACTIN No results found for: CHOL, TRIG, HDL, CHOLHDL, VLDL, LDLCALC  Current Medications: Current Facility-Administered Medications  Medication Dose Route Frequency Provider Last Rate Last Dose  . acetaminophen (TYLENOL) tablet 650 mg  650 mg Oral Q6H PRN Jackelyn PolingBerry, Quame Spratlin A, NP      . alum & mag hydroxide-simeth (MAALOX/MYLANTA) 200-200-20 MG/5ML suspension 30 mL  30 mL Oral Q4H PRN Nira ConnBerry, Kyung Muto A, NP      . hydrOXYzine (ATARAX/VISTARIL) tablet 25 mg  25 mg Oral TID PRN Nira ConnBerry, Ciela Mahajan A, NP      . magnesium hydroxide (MILK OF MAGNESIA) suspension 30 mL  30 mL Oral Daily PRN Jackelyn PolingBerry, Kayly Kriegel A, NP       PTA Medications: Medications Prior to Admission  Medication Sig Dispense Refill Last Dose  . ibuprofen (ADVIL) 200 MG tablet Take 400 mg by  mouth every 6 (six) hours as needed for moderate pain.     Marland Kitchen. ibuprofen (ADVIL,MOTRIN) 600 MG tablet Take 1 tablet (600 mg total) by mouth every 6 (six) hours as needed. (Patient not taking: Reported on 06/25/2019) 30 tablet 0   . meclizine (ANTIVERT) 25 MG tablet Take 1 tablet (25 mg total) by mouth 3 (three) times daily as needed  for dizziness. (Patient not taking: Reported on 11/10/2018) 30 tablet 0   . ondansetron (ZOFRAN ODT) 4 MG disintegrating tablet Take 1 tablet (4 mg total) by mouth every 8 (eight) hours as needed. (Patient not taking: Reported on 06/25/2019) 10 tablet 0   . ondansetron (ZOFRAN) 4 MG tablet Take 1 tablet (4 mg total) by mouth every 6 (six) hours. (Patient not taking: Reported on 11/10/2018) 12 tablet 0   . Pediatric Multiple Vit-C-FA (PEDIATRIC MULTIVITAMIN) chewable tablet Chew 2 tablets by mouth daily.     . promethazine (PHENERGAN) 25 MG tablet Take 1 tablet (25 mg total) by mouth every 6 (six) hours as needed for nausea or vomiting. (Patient not taking: Reported on 02/06/2017) 10 tablet 0     Musculoskeletal: Strength & Muscle Tone: within normal limits Gait & Station: normal Patient leans: N/A  Psychiatric Specialty Exam: Physical Exam  ROS  There were no vitals taken for this visit.There is no height or weight on file to calculate BMI.  General Appearance: Casual and Well Groomed  Eye Contact:  Fair  Speech:  Clear and Coherent and Normal Rate  Volume:  Normal  Mood:  Anxious and Depressed  Affect:  Congruent and Depressed  Thought Process:  Coherent and Linear  Orientation:  Full (Time, Place, and Person)  Thought Content:  Hallucinations: Denies current AVH  Suicidal Thoughts:  Denies  Homicidal Thoughts:  Denies  Memory:  Immediate;   Good Recent;   Good  Judgement:  Fair  Insight:  Lacking  Psychomotor Activity:  Restlessness  Concentration:  Concentration: Good and Attention Span: Good  Recall:  Good  Fund of Knowledge:  Good  Language:  Good   Akathisia:  Negative  Handed:  Right  AIMS (if indicated):     Assets:  Communication Skills Desire for Improvement Financial Resources/Insurance Housing Leisure Time Physical Health  ADL's:  Intact  Cognition:  WNL  Sleep:         Treatment Plan Summary: Daily contact with patient to assess and evaluate symptoms and progress in treatment  Observation Level/Precautions:  15 minute checks Laboratory:  See ED labs Psychotherapy:  Individual Medications:  Hydroxyzine 25 mg TID prn anxiety Consultations:   Discharge Concerns:  safety Estimated LOS: Other:      Jackelyn Poling, NP 10/4/20203:38 AM

## 2019-08-01 ENCOUNTER — Observation Stay (HOSPITAL_COMMUNITY)
Admission: AD | Admit: 2019-08-01 | Discharge: 2019-08-02 | Disposition: A | Discharge status: Home / Self Care | Payer: 59 | Attending: Psychiatry | Admitting: Psychiatry

## 2019-08-01 DIAGNOSIS — Z20828 Contact with and (suspected) exposure to other viral communicable diseases: Secondary | ICD-10-CM | POA: Diagnosis not present

## 2019-08-01 DIAGNOSIS — R45850 Homicidal ideations: Secondary | ICD-10-CM | POA: Diagnosis not present

## 2019-08-01 DIAGNOSIS — F4323 Adjustment disorder with mixed anxiety and depressed mood: Secondary | ICD-10-CM | POA: Diagnosis present

## 2019-08-01 DIAGNOSIS — F4325 Adjustment disorder with mixed disturbance of emotions and conduct: Secondary | ICD-10-CM | POA: Diagnosis not present

## 2019-08-01 DIAGNOSIS — F332 Major depressive disorder, recurrent severe without psychotic features: Secondary | ICD-10-CM | POA: Insufficient documentation

## 2019-08-01 NOTE — H&P (Signed)
[place] [place] Observation Unit Provider Admission PAA/H&P  Patient Identification: Jimmy Barr MRN:  [id number] Date of Evaluation:  08/01/2019 Chief Complaint:  MDD recurrent severe  Principal Diagnosis: Adjustment disorder with mixed disturbance of emotions and conduct Diagnosis:  Principal Problem:   Adjustment disorder with mixed disturbance of emotions and conduct Active Problems:   Homicidal ideation  History of Present Illness:   TTS Assessment:  Jimmy Barr is an 28 y.o. male presenting as a walk-in at [facility] due to HI, hallucinations and anxiety. Patient reported HI onset 1 week ago, as his neighbor continues to antagonize about sleeping with his girlfriend, by banging on door and running away, making threats and arguing. Patient reported feeling uncomfortable and that he is scared that he will retaliate as he has had thoughts earlier this week to "shoot at him". Patient denied having access to a gun at this time. Patient would not disclose the neighbors name and which location the neighbor lived in. Patient reported working things out with his girlfriend but its hard when the neighbor continues to taunt him about it. Patient reported hearing noises and seeing people say his name, no additional information given. However patient reported the voices are not command voices. Patient reported worsening depressive symptoms feelings of worthlessness, isolating, irritability, increased anxiety and insomnia. Patient reported getting 4 hours sleep nightly and loss of appetite. Patent was seen on 06/26/2019 at [facility] for similar presentation.   Patient and girlfriend currently lives together. Patient is currently employed with no work-related stressors. Patient was cooperative during assessment and stated he did not feel safe if he was to be discharged, "I fear that I am going to retaliate".   Evaluation on Unit: Reviewed TTS assessment and validated with patient. Patient states "I did something  to my neighbor and I am afraid that he may retaliate." Patient would not elaborate but stated that he did do something that is probably going to anger his neighbor. Patient reports that he continues to have issues trusting his girlfriend after she cheated with the neighbor. This has led to depression, anxiety, and insomnia. Patient was seen on 06/25/19 for similar issue and was started on hydroxyzine, which he feel was helpful. On evaluation patient is alert and oriented x 4, pleasant, and cooperative. Speech is clear and coherent. Mood is depressed and anxious. Affect is congruent with mood. Thought process is coherent and thought content is logical. Denies suicidal ideations. Endorses homicidal ideations towards neighbor. Reports occasional alcohol use. Denies audiovisual hallucinations. No indication that patient is responding to internal stimuli.    Associated Signs/Symptoms: Depression Symptoms:  depressed mood, insomnia, psychomotor agitation, difficulty concentrating, anxiety, (Hypo) Manic Symptoms:  Irritable Mood, Anxiety Symptoms:  Excessive Worry, Psychotic Symptoms:  Denies PTSD Symptoms: Negative Total Time spent with patient: 30 minutes  Past Psychiatric History: Adjustment Disorder  Is the patient at risk to self? No.  Has the patient been a risk to self in the past 6 months? No.  Has the patient been a risk to self within the distant past? No.  Is the patient a risk to others? Yes.    Has the patient been a risk to others in the past 6 months? Yes.    Has the patient been a risk to others within the distant past? No.   Prior Inpatient Therapy: Prior Inpatient Therapy: No Prior Outpatient Therapy: Prior Outpatient Therapy: No Does patient have an ACCT team?: No Does patient have Intensive In-House Services?  : No Does patient  have [facility] services? : No Does patient have P4CC services?: No  Alcohol Screening:   Substance Abuse History in the last 12 months:  Yes.    Consequences of Substance Abuse: NA Previous Psychotropic Medications: Yes  Psychological Evaluations: No  Past Medical History: No past medical history on file. No past surgical history on file. Family History: No family history on file. Family Psychiatric History: Unknown Tobacco Screening:   Social History:  Social History   Substance and Sexual Activity  Alcohol Use Yes   Comment: social     Social History   Substance and Sexual Activity  Drug Use Yes  . Types: Marijuana    Additional Social History: Marital status: Single    Pain Medications: see MAR Prescriptions: see MAR Over the Counter: see MAR                    Allergies:  No Known Allergies Lab Results: No results found for this or any previous visit (from the past 48 hour(s)).  Blood Alcohol level:  Lab Results  Component Value Date   ETH <10 06/25/2019    Metabolic Disorder Labs:  No results found for: HGBA1C, MPG No results found for: PROLACTIN No results found for: CHOL, TRIG, HDL, CHOLHDL, VLDL, LDLCALC  Current Medications: No current facility-administered medications for this encounter.    PTA Medications: Medications Prior to Admission  Medication Sig Dispense Refill Last Dose  . hydrOXYzine (ATARAX/VISTARIL) 25 MG tablet Take 1 tablet (25 mg total) by mouth 3 (three) times daily as needed for anxiety. 30 tablet 0     Musculoskeletal: Strength & Muscle Tone: within normal limits Gait & Station: normal Patient leans: N/A  Psychiatric Specialty Exam: Physical Exam  Constitutional: He is oriented to person, place, and time. He appears well-developed and well-nourished. No distress.  HENT:  Head: Normocephalic and atraumatic.  Right Ear: External ear normal.  Left Ear: External ear normal.  Eyes: Pupils are equal, round, and reactive to light. Right eye exhibits no discharge. Left eye exhibits no discharge.  Respiratory: Effort normal. No respiratory distress.   Musculoskeletal: Normal range of motion.  Neurological: He is alert and oriented to person, place, and time.  Skin: He is not diaphoretic.  Psychiatric: His mood appears anxious. He is not withdrawn and not actively hallucinating. Thought content is not paranoid and not delusional. He expresses impulsivity and inappropriate judgment. He exhibits a depressed mood. He expresses homicidal ideation. He expresses no suicidal ideation. He expresses no homicidal plans.    Review of Systems  Constitutional: Negative for chills, diaphoresis, fever, malaise/fatigue and weight loss.  Respiratory: Negative for cough and shortness of breath.   Cardiovascular: Negative for chest pain.  Gastrointestinal: Negative for diarrhea, nausea and vomiting.  Psychiatric/Behavioral: Positive for depression. Negative for hallucinations, memory loss, substance abuse and suicidal ideas. The patient is nervous/anxious and has insomnia.     Blood pressure 126/78, pulse 87, temperature 98.9 F (37.2 C), resp. rate 20, SpO2 100 %.There is no height or weight on file to calculate BMI.  General Appearance: Casual and Well Groomed  Eye Contact:  Fair  Speech:  Clear and Coherent and Normal Rate  Volume:  Normal  Mood:  Anxious  Affect:  Congruent and Full Range  Thought Process:  Coherent, Goal Directed, Linear and Descriptions of Associations: Intact  Orientation:  Full (Time, Place, and Person)  Thought Content:  Logical and Hallucinations: None  Suicidal Thoughts:  No  Homicidal Thoughts:  Yes.  without intent/plan  Memory:  Immediate;   Good Recent;   Good Remote;   Good  Judgement:  Impaired  Insight:  Lacking  Psychomotor Activity:  Normal  Concentration:  Concentration: Good and Attention Span: Good  Recall:  Good  Fund of Knowledge:  Good  Language:  Good  Akathisia:  Negative  Handed:  Right  AIMS (if indicated):     Assets:  Communication Skills Desire for Improvement Financial  Resources/Insurance Housing Leisure Time Physical Health  ADL's:  Intact  Cognition:  WNL  Sleep:         Treatment Plan Summary: Daily contact with patient to assess and evaluate symptoms and progress in treatment and Medication management  Observation Level/Precautions:  15 minute checks Laboratory:  CBC Chemistry Profile UDS Psychotherapy:  Individual Medications:  Hydroxyzine 25 mg TID prn anxiety Consultations:  As needed Discharge Concerns:  safety Estimated LOS: Other:      Jackelyn Poling, NP 11/9/202011:22 PM

## 2019-08-01 NOTE — BH Assessment (Addendum)
Assessment Note  Jimmy Barr is an 28 y.o. male presenting as a walk-in at [facility] due to HI, hallucinations and anxiety. Patient reported HI onset 1 week ago, as his neighbor continues to antagonize about sleeping with his girlfriend, by banging on door and running away, making threats and arguing. Patient reported feeling uncomfortable and that he is scared that he will retaliate as he has had thoughts earlier this week to "shoot at him". Patient denied having access to a gun at this time. Patient would not disclose the neighbors name and which location the neighbor lived in. Patient reported working things out with his girlfriend but its hard when the neighbor continues to taunt him about it. Patient reported hearing noises and seeing people say his name, no additional information given. However patient reported the voices are not command voices. Patient reported worsening depressive symptoms feelings of worthlessness, isolating, irritability, increased anxiety and insomnia. Patient reported getting 4 hours sleep nightly and loss of appetite. Patent was seen on 06/26/2019 at [facility] for similar presentation.   Patient and girlfriend currently lives together. Patient is currently employed with no work-related stressors. Patient was cooperative during assessment and stated he did not feel safe if he was to be discharged, "I fear that I am going to retaliate".   PER EDP NOTE ON 06/26/2019: Patient was brought to the ED by his friends. Patient reports that he experiences auditory hallucinations since the age of 28 years old. Patient denies taking medication management for his auditory hallucinations. Patient denies command auditory hallucinations. Patient denies prior outpatient therapy. Patient reports trust issues and feeling paranoia. Patient reports that his girlfriend slept with neighbor and the neighbor sent picture to him of them together. Patient reports that he was angry and stated that he  wanted to harm himself and his girlfriend. However, patient stated that, "he was not serious about harming anyone, he was just angry". Patient reports that he is breaking up with his girlfriend and she is in the process of moving out. Patient reports that he has lived with his girlfriend for a year and a half and they have been together for 2 years. 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  Diagnosis: Major Depressive Disorder  Past Medical History: No past medical history on file.  No past surgical history on file.  Family History: No family history on file.  Social History:  reports that he has been smoking. He has been smoking about 0.10 packs per day. He has never used smokeless tobacco. He reports current alcohol use. He reports current drug use. Drug: Marijuana.  Additional Social History:  Alcohol / Drug Use Pain Medications: see MAR Prescriptions: see MAR Over the Counter: see MAR  CIWA: CIWA-Ar BP: 126/78 Pulse Rate: 87 COWS:    Allergies: No Known Allergies  Home Medications:  Medications Prior to Admission  Medication Sig Dispense Refill  . hydrOXYzine (ATARAX/VISTARIL) 25 MG tablet Take 1 tablet (25 mg total) by mouth 3 (three) times daily as needed for anxiety. 30 tablet 0    OB/GYN Status:  No LMP for male patient.  General Assessment Data Location of Assessment: [facility] Assessment Services TTS Assessment: In system Is this a Tele or Face-to-Face Assessment?: Face-to-Face Is this an Initial Assessment or a Re-assessment for this encounter?: Initial Assessment Patient Accompanied by:: N/A Language Other than English: No Living Arrangements: Other (Comment)(ex girlfriend) What gender do you identify as?: Male Marital status: Single  Living Arrangements: Spouse/significant other Can pt return to current living arrangement?: Yes Admission Status: Voluntary Is patient capable of signing voluntary admission?:  Yes Referral Source: Self/Family/Friend     Crisis Care Plan Living Arrangements: Spouse/significant other Legal Guardian: (self) Name of Psychiatrist: (none) Name of Therapist: (none)  Education Status Is patient currently in school?: No Is the patient employed, unemployed or receiving disability?: Employed  Risk to self with the past 6 months Suicidal Ideation: No Has patient been a risk to self within the past 6 months prior to admission? : No Suicidal Intent: No Has patient had any suicidal intent within the past 6 months prior to admission? : No Is patient at risk for suicide?: No Suicidal Plan?: No Has patient had any suicidal plan within the past 6 months prior to admission? : No What has been your use of drugs/alcohol within the last 12 months?: (hx of alcohol) Previous Attempts/Gestures: No How many times?: (0) Other Self Harm Risks: (n/a) Triggers for Past Attempts: (n/a) Intentional Self Injurious Behavior: None Family Suicide History: No Recent stressful life event(s): (confrontation) Persecutory voices/beliefs?: No Depression: Yes Depression Symptoms: Insomnia, Isolating, Loss of interest in usual pleasures, Feeling angry/irritable, Feeling worthless/self pity Substance abuse history and/or treatment for substance abuse?: No Suicide prevention information given to non-admitted patients: Not applicable  Risk to Others within the past 6 months Homicidal Ideation: Yes-Currently Present Does patient have any lifetime risk of violence toward others beyond the six months prior to admission? : No Thoughts of Harm to Others: Yes-Currently Present Comment - Thoughts of Harm to Others: (neighbor is antagonizing ) Current Homicidal Intent: Yes-Currently Present Current Homicidal Plan: Yes-Currently Present Describe Current Homicidal Plan: (to shoot at him) Access to Homicidal Means: (denied) Identified Victim: (a neighbor) History of harm to others?: No Assessment of  Violence: None Noted Violent Behavior Description: (none reported) Does patient have access to weapons?: (denied) Criminal Charges Pending?: No Does patient have a court date: No Is patient on probation?: No  Psychosis Hallucinations: Auditory, Visual Delusions: None noted  Mental Status Report Appearance/Hygiene: Unremarkable Eye Contact: Fair Motor Activity: Freedom of movement Speech: Logical/coherent Level of Consciousness: Alert Mood: Depressed, Anxious Affect: Anxious, Depressed Anxiety Level: Moderate Thought Processes: Coherent, Relevant Judgement: Partial Orientation: Person, Place, Time, Situation Obsessive Compulsive Thoughts/Behaviors: Minimal  Cognitive Functioning Concentration: Good Memory: Recent Intact Is patient IDD: No Insight: Poor Impulse Control: Poor Appetite: Poor Have you had any weight changes? : No Change Sleep: Decreased Total Hours of Sleep: (4) Vegetative Symptoms: None  ADLScreening [facility] Assessment Services) Patient's cognitive ability adequate to safely complete daily activities?: Yes Patient able to express need for assistance with ADLs?: Yes Independently performs ADLs?: Yes (appropriate for developmental age)  Prior Inpatient Therapy Prior Inpatient Therapy: No  Prior Outpatient Therapy Prior Outpatient Therapy: No Does patient have an ACCT team?: No Does patient have Intensive In-House Services?  : No Does patient have [facility] services? : No Does patient have P4CC services?: No  ADL Screening (condition at time of admission) Patient's cognitive ability adequate to safely complete daily activities?: Yes Patient able to express need for assistance with ADLs?: Yes Independently performs ADLs?: Yes (appropriate for developmental age)    Disposition:  Disposition Initial Assessment Completed for this Encounter: Yes  Lindon Romp, NP, recommends overnight observation for safety and stabilization with psych re evaluation in the  AM. [facility] informed of disposition.  On Site Evaluation by:   Reviewed with Physician:    Venora Maples 08/01/2019 11:11  PM

## 2019-08-02 ENCOUNTER — Other Ambulatory Visit: Payer: Self-pay

## 2019-08-02 ENCOUNTER — Encounter (HOSPITAL_COMMUNITY): Payer: Self-pay | Admitting: Emergency Medicine

## 2019-08-02 DIAGNOSIS — F4325 Adjustment disorder with mixed disturbance of emotions and conduct: Secondary | ICD-10-CM | POA: Diagnosis not present

## 2019-08-02 LAB — COMPREHENSIVE METABOLIC PANEL
ALT: 13 U/L (ref 0–44)
AST: 15 U/L (ref 15–41)
Albumin: 4 g/dL (ref 3.5–5.0)
Alkaline Phosphatase: 60 U/L (ref 38–126)
Anion gap: 7 (ref 5–15)
BUN: 16 mg/dL (ref 6–20)
CO2: 28 mmol/L (ref 22–32)
Calcium: 9.7 mg/dL (ref 8.9–10.3)
Chloride: 104 mmol/L (ref 98–111)
Creatinine, Ser: 1.24 mg/dL (ref 0.61–1.24)
GFR calc Af Amer: >60 mL/min (ref >60)
GFR calc non Af Amer: >60 mL/min (ref >60)
Glucose, Bld: 94 mg/dL (ref 70–99)
Potassium: 4.7 mmol/L (ref 3.5–5.1)
Sodium: 139 mmol/L (ref 135–145)
Total Bilirubin: 0.4 mg/dL (ref 0.3–1.2)
Total Protein: 7.1 g/dL (ref 6.5–8.1)

## 2019-08-02 LAB — SARS CORONAVIRUS 2 BY RT PCR (HOSPITAL ORDER, PERFORMED IN ~~LOC~~ HOSPITAL LAB): SARS Coronavirus 2: NEGATIVE

## 2019-08-02 LAB — CBC
HCT: 46 % (ref 39.0–52.0)
Hemoglobin: 14.3 g/dL (ref 13.0–17.0)
MCH: 26.8 pg (ref 26.0–34.0)
MCHC: 31.1 g/dL (ref 30.0–36.0)
MCV: 86.3 fL (ref 80.0–100.0)
Platelets: 268 10*3/uL (ref 150–400)
RBC: 5.33 MIL/uL (ref 4.22–5.81)
RDW: 14 % (ref 11.5–15.5)
WBC: 6 10*3/uL (ref 4.0–10.5)
nRBC: 0 % (ref 0.0–0.2)

## 2019-08-02 MED ORDER — TRAZODONE HCL 50 MG PO TABS: 50.0000 mg | ORAL_TABLET | Freq: Every evening | ORAL | 0 refills | Status: DC | PRN

## 2019-08-02 MED ORDER — MAGNESIUM HYDROXIDE 400 MG/5ML PO SUSP: 30.0000 mL | Freq: Every day | ORAL | Status: DC | PRN

## 2019-08-02 MED ORDER — HYDROXYZINE HCL 25 MG PO TABS: 25.0000 mg | ORAL_TABLET | Freq: Three times a day (TID) | ORAL | Status: DC | PRN

## 2019-08-02 MED ORDER — TRAZODONE HCL 50 MG PO TABS: 50.0000 mg | ORAL_TABLET | Freq: Every evening | ORAL | Status: DC | PRN

## 2019-08-02 MED ORDER — ALUM & MAG HYDROXIDE-SIMETH 200-200-20 MG/5ML PO SUSP: 30.0000 mL | ORAL | Status: DC | PRN

## 2019-08-02 MED ORDER — ACETAMINOPHEN 325 MG PO TABS: 650.0000 mg | ORAL_TABLET | Freq: Four times a day (QID) | ORAL | Status: DC | PRN

## 2019-08-02 NOTE — Discharge Instructions (Addendum)
For your behavioral health needs, you are advised to follow up with the Mental Health Intensive Outpatient Program (MH-IOP) at the [facility] [place] [facility] [place].  This program meets Monday - Friday from 9:00 am - 12:00 pm.  Due to Covid-19, this program is currently being run virtually.  You are scheduled for an intake appointment on Thursday, August 04, 2019 at 9:00 am.  Please complete the registration paperwork given to you by the [facility] staff and have it ready before this date.  If you have any questions, contact Dellia Nims, MEd at the phone number indicated below:       [facility]. [address] [place], [place] [address]      Contact person: Dellia Nims, MEd      [phone]

## 2019-08-02 NOTE — BH Assessment (Signed)
[place] Assessment Progress Note  Per Shuvon Rankin, FNP, this pt will not require psychiatric hospitalization provided that a social contact can be reached to enhance pt's safety, and pt agrees to follow up with the Mental Health Intensive Outpatient Program ([place]) at the [facility] at [place].  This Probation officer spoke to pt in person and he agrees to this plan.  He reports that he has Internet connectivity at home, along with a suitable device for virtual programming.  He names a friend Gwyndolyn Saxon [phone]) and agrees for me to call him.  At 13:38 I called Erlene Quan, who agrees to be available for the pt.  I have provided him with the [facility] Mobile Crisis number.  At 13:43 I spoke to Dellia Nims, [place], who has scheduled pt for an intake appointment on Thursday, 08/04/2019 at 09:00.  This has been included in pt's discharge instructions.  I have provided Velva Harman with pt's phone number and e-mail address [phone] and [email or web address] respectively).  Pt's nurse, Nicoletta Dress, has been notified.  Jalene Mullet, [place] Triage Specialist [phone]

## 2019-08-02 NOTE — H&P (Signed)
Behavioral Health Medical Screening Exam  Jimmy Barr is an 28 y.o. male.  Psychiatric Specialty Exam: Physical Exam  Constitutional: He is oriented to person, place, and time. He appears well-developed and well-nourished. No distress.  HENT:  Head: Normocephalic and atraumatic.  Right Ear: External ear normal.  Left Ear: External ear normal.  Eyes: Pupils are equal, round, and reactive to light. Right eye exhibits no discharge. Left eye exhibits no discharge.  Respiratory: Effort normal. No respiratory distress.  Musculoskeletal: Normal range of motion.  Neurological: He is alert and oriented to person, place, and time.  Skin: He is not diaphoretic.  Psychiatric: His mood appears anxious. He is not withdrawn and not actively hallucinating. Thought content is not paranoid and not delusional. He expresses impulsivity and inappropriate judgment. He exhibits a depressed mood. He expresses homicidal ideation. He expresses no suicidal ideation. He expresses no homicidal plans.    Review of Systems  Constitutional: Negative for chills, diaphoresis, fever, malaise/fatigue and weight loss.  Respiratory: Negative for cough and shortness of breath.   Cardiovascular: Negative for chest pain.  Gastrointestinal: Negative for diarrhea, nausea and vomiting.  Psychiatric/Behavioral: Positive for depression. Negative for hallucinations, memory loss, substance abuse and suicidal ideas. The patient is nervous/anxious and has insomnia.     Blood pressure 126/78, pulse 87, temperature 98.9 F (37.2 C), resp. rate 20, SpO2 100 %.There is no height or weight on file to calculate BMI.  General Appearance: Casual and Well Groomed  Eye Contact:  Fair  Speech:  Clear and Coherent and Normal Rate  Volume:  Normal  Mood:  Anxious  Affect:  Congruent and Full Range  Thought Process:  Coherent, Goal Directed, Linear and Descriptions of Associations: Intact  Orientation:  Full (Time, Place, and Person)   Thought Content:  Logical and Hallucinations: None  Suicidal Thoughts:  No  Homicidal Thoughts:  Yes.  without intent/plan  Memory:  Immediate;   Good Recent;   Good Remote;   Good  Judgement:  Impaired  Insight:  Lacking  Psychomotor Activity:  Normal  Concentration:  Concentration: Good and Attention Span: Good  Recall:  Good  Fund of Knowledge:  Good  Language:  Good  Akathisia:  Negative  Handed:  Right  AIMS (if indicated):     Assets:  Communication Skills Desire for Improvement Financial Resources/Insurance Housing Leisure Time Physical Health  ADL's:  Intact  Cognition:  WNL  Sleep:         Blood pressure 126/78, pulse 87, temperature 98.9 F (37.2 C), resp. rate 20, SpO2 100 %.  Recommendations:  Based on my evaluation the patient does not appear to have an emergency medical condition.  Rozetta Nunnery, NP 08/02/2019, 2:30 AM

## 2019-08-02 NOTE — Progress Notes (Signed)
Patient ID: Jimmy Barr, male   DOB: 1991-08-13, 28 y.o.   MRN: [id number] Pt A&O x 4, presents with HI towards neighbor who slept with girlfriend.  Pt reports he is being harassed by the neighbor.  Plan to shoot at neighbor.  Calm & cooperative at present.  Denies SI or AVH.  Monitoring for safety.

## 2019-08-02 NOTE — Discharge Summary (Addendum)
[facility] Psych Observation Discharge  08/02/2019 1:55 PM Jimmy Barr  MRN:  [id number] Principal Problem: Adjustment disorder with mixed disturbance of emotions and conduct Discharge Diagnoses: Principal Problem:   Adjustment disorder with mixed disturbance of emotions and conduct Active Problems:   Homicidal ideation   Subjective: Jimmy Barr, 28 y.o., male patient seen via tele psych by this provider, Dr. Jama Flavors; and chart reviewed on 08/02/19.  On evaluation Jimmy Barr reports " I came to the hospital because I wanted to get away from certain situation get out of the environment before I made a drastic decision."  Patient reports that he had thoughts of harming his neighbor related to his neighbor having an affair with his girlfriend.  States he and his girlfriend was living together until the affair with a neighbor now he just needs to move out of the area.  Patient reports his main stressor is being in the environment because " myself is staying is down, it is my pride, I just wanted remove myself from the situation."  Patient reports that he was in the hospital observation 2 weeks ago " for the same thing."  States he needs someone to talk to patient states that he is interested in outpatient psychiatric services.  Reports he is currently looking for another place to live he is willing to break the lease on the apartment he has now moved somewhere else.  At this time patient denies suicidal/self-harm/homicidal ideations, psychosis, paranoia.  Patient reports that he has thought of hurting the neighbor but " I know I am not going to do anything the risk having to go to jail again myself in trouble.  Is just thoughts."  Patient reports that his mother also lives in [place] and has been very supportive patient gave permission to speak to his mother for collateral information.  Discussed partial hospitalization program or intensive outpatient with patient.  Patient reports that he  works out of his home and either program will be fine. During evaluation Jimmy Barr is alert/oriented x 4; calm/cooperative; and mood is congruent with affect.  He does not appear to be responding to internal/external stimuli or delusional thoughts.  Patient denies suicidal/self-harm/homicidal ideation, psychosis, and paranoia.  Patient answered question appropriately.    Patient's mother is Amedeo Gory [phone]).   Total Time spent with patient: 30 minutes  Past Psychiatric History: Adjustment disorder, depression  Past Medical History: History reviewed. No pertinent past medical history. History reviewed. No pertinent surgical history. Family History: History reviewed. No pertinent family history. Family Psychiatric  History: Denies Social History:  Social History   Substance and Sexual Activity  Alcohol Use Yes   Comment: social     Social History   Substance and Sexual Activity  Drug Use Yes  . Types: Marijuana    Social History   Socioeconomic History  . Marital status: Single    Spouse name: Not on file  . Number of children: Not on file  . Years of education: Not on file  . Highest education level: Not on file  Occupational History  . Not on file  Social Needs  . Financial resource strain: Not on file  . Food insecurity    Worry: Not on file    Inability: Not on file  . Transportation needs    Medical: Not on file    Non-medical: Not on file  Tobacco Use  . Smoking status: Current Some Day Smoker    Packs/day: 0.10  . Smokeless  tobacco: Never Used  Substance and Sexual Activity  . Alcohol use: Yes    Comment: social  . Drug use: Yes    Types: Marijuana  . Sexual activity: Yes  Lifestyle  . Physical activity    Days per week: Not on file    Minutes per session: Not on file  . Stress: Not on file  Relationships  . Social Herbalist on phone: Not on file    Gets together: Not on file    Attends religious service: Not on file     Active member of club or organization: Not on file    Attends meetings of clubs or organizations: Not on file    Relationship status: Not on file  Other Topics Concern  . Not on file  Social History Narrative  . Not on file    Has this patient used any form of tobacco in the last 30 days? (Cigarettes, Smokeless Tobacco, Cigars, and/or Pipes) A prescription for an FDA-approved tobacco cessation medication was offered at discharge and the patient refused  Current Medications: Current Facility-Administered Medications  Medication Dose Route Frequency Provider Last Rate Last Dose  . acetaminophen (TYLENOL) tablet 650 mg  650 mg Oral Q6H PRN Rozetta Nunnery, NP      . alum & mag hydroxide-simeth (MAALOX/MYLANTA) 200-200-20 MG/5ML suspension 30 mL  30 mL Oral Q4H PRN Lindon Romp A, NP      . hydrOXYzine (ATARAX/VISTARIL) tablet 25 mg  25 mg Oral TID PRN Lindon Romp A, NP      . magnesium hydroxide (MILK OF MAGNESIA) suspension 30 mL  30 mL Oral Daily PRN Rozetta Nunnery, NP      . traZODone (DESYREL) tablet 50 mg  50 mg Oral QHS PRN Rozetta Nunnery, NP       PTA Medications: Medications Prior to Admission  Medication Sig Dispense Refill Last Dose  . hydrOXYzine (ATARAX/VISTARIL) 25 MG tablet Take 1 tablet (25 mg total) by mouth 3 (three) times daily as needed for anxiety. 30 tablet 0     Musculoskeletal: Strength & Muscle Tone: within normal limits Gait & Station: normal Patient leans: N/A  Psychiatric Specialty Exam: Physical Exam  ROS  Blood pressure 126/78, pulse 87, temperature 98.9 F (37.2 C), resp. rate 20, SpO2 100 %.There is no height or weight on file to calculate BMI.  General Appearance: Casual  Eye Contact:  Good  Speech:  Clear and Coherent and Normal Rate  Volume:  Normal  Mood:  I'm good; feel better"  Appropriate  Affect:  Appropriate and Congruent  Thought Process:  Coherent, Goal Directed and Descriptions of Associations: Intact  Orientation:  Full (Time,  Place, and Person)  Thought Content:  WDL  Suicidal Thoughts:  No  Homicidal Thoughts:  No  Memory:  Immediate;   Good Recent;   Good  Judgement:  Intact  Insight:  Good and Present  Psychomotor Activity:  Normal  Concentration:  Concentration: Good and Attention Span: Good  Recall:  Good  Fund of Knowledge:  Good  Language:  Good  Akathisia:  No  Handed:  Right  AIMS (if indicated):     Assets:  Communication Skills Desire for Improvement Financial Resources/Insurance Resilience Social Support Transportation  ADL's:  Intact  Cognition:  WNL  Sleep:        Demographic Factors:  Male  Loss Factors: NA  Historical Factors: NA  Risk Reduction Factors:   Sense of responsibility to family, Religious  beliefs about death, Employed and Positive social support  Continued Clinical Symptoms:  Previous Psychiatric Diagnoses and Treatments  Cognitive Features That Contribute To Risk:  None    Suicide Risk:  Minimal: No identifiable suicidal ideation.  Patients presenting with no risk factors but with morbid ruminations; may be classified as minimal risk based on the severity of the depressive symptoms    Plan Of Care/Follow-up recommendations:  Activity:  As tolerated Diet:  Heart healthy Other:  Follow-up with [facility] outpatient IOP   Disposition: No evidence of imminent risk to self or others at present.   Patient does not meet criteria for psychiatric inpatient admission. Supportive therapy provided about ongoing stressors. Discussed crisis plan, support from social network, calling 911, coming to the Emergency Department, and calling Suicide Hotline.    Discharge Instructions     For your behavioral health needs, you are advised to follow up with the Mental Health Intensive Outpatient Program (MH-IOP) at the [facility] [place].  This program meets Monday - Friday from 9:00 am - 12:00 pm.  Due to Covid-19, this program is  currently being run virtually.  You are scheduled for an intake appointment on Thursday, August 04, 2019 at 9:00 am.  Please complete the registration paperwork given to you by the [facility] staff and have it ready before this date.  If you have any questions, contact Jeri Modenaita Clark, MEd at the phone number indicated below:       [facility]      [address]      [place], [place] [address]      Contact person: Jeri ModenaRita Clark, MEd      [phone]     Assunta FoundShuvon Rankin, NP 08/02/2019, 1:55 PM   Attest to NP note

## 2019-08-02 NOTE — Plan of Care (Signed)
[place] Observation Crisis Plan  Reason for Crisis Plan:  Crisis Stabilization   Plan of Care:  Referral for Inpatient Hospitalization  Family Support:      Current Living Environment:  Living Arrangements: Spouse/significant other  Insurance:   Hospital Account    Name Acct ID Class Status Primary Coverage   Jimmy Barr, Jimmy Barr [id number] Jimmy Barr        Guarantor Account (for Hospital Account [id number])    Name Relation to Pt Service Area Active? Acct Type   Jimmy Barr Self [facility] Yes Behavioral Health   Address Phone       [address] [place], [place] [address] [phone](H)          Coverage Information (for Hospital Account [id number])    F/O Payor/Plan Precert #   [place] #   Jimmy Barr [id number]   Address Phone   [address] [place] [address] [phone]      Legal Guardian:  Legal Guardian: (self)  Primary Care Provider:  Patient, No Pcp Per  Current Outpatient Providers:  Jimmy Abbot, MD  Psychiatrist:  Name of Psychiatrist: (none)  Counselor/Therapist:  Name of Therapist: (none)  Compliant with Medications:  No  Additional Information:   Jimmy Barr 11/10/20202:01 AM

## 2019-08-02 NOTE — Progress Notes (Signed)
D: Pt A & O X 4. Denies SI, HI, AVH and pain at this time. Presents anxious with brief eye contact and logical / clear speech.Pt D/C home as ordered. Drove his own car home. A: D/C instructions reviewed with pt including prescription and instructions given to follow up with [facility] outpatient; compliance encouraged. All belongings from assigned locker given to pt at time of departure.  Safety checks maintained without incident till time of d/c.  R: Pt receptive to care. Verbalized understanding related to d/c instructions. Signed belonging sheet in agreement with items received from locker. Ambulatory with a steady gait. Appears to be in no physical distress at time of departure.

## 2019-08-03 ENCOUNTER — Telehealth (HOSPITAL_COMMUNITY): Payer: Self-pay | Admitting: Professional

## 2019-08-04 ENCOUNTER — Other Ambulatory Visit: Payer: Self-pay

## 2019-08-04 ENCOUNTER — Telehealth (HOSPITAL_COMMUNITY): Payer: Self-pay | Admitting: Professional

## 2019-08-04 ENCOUNTER — Other Ambulatory Visit (HOSPITAL_COMMUNITY): Payer: 59 | Attending: Psychiatry | Admitting: Licensed Clinical Social Worker

## 2019-08-04 DIAGNOSIS — F329 Major depressive disorder, single episode, unspecified: Secondary | ICD-10-CM | POA: Insufficient documentation

## 2019-08-04 DIAGNOSIS — Z658 Other specified problems related to psychosocial circumstances: Secondary | ICD-10-CM | POA: Insufficient documentation

## 2019-08-04 DIAGNOSIS — F22 Delusional disorders: Secondary | ICD-10-CM | POA: Insufficient documentation

## 2019-08-04 DIAGNOSIS — F419 Anxiety disorder, unspecified: Secondary | ICD-10-CM | POA: Diagnosis not present

## 2019-08-04 DIAGNOSIS — F333 Major depressive disorder, recurrent, severe with psychotic symptoms: Secondary | ICD-10-CM

## 2019-08-04 DIAGNOSIS — F332 Major depressive disorder, recurrent severe without psychotic features: Secondary | ICD-10-CM | POA: Insufficient documentation

## 2019-08-04 DIAGNOSIS — F17210 Nicotine dependence, cigarettes, uncomplicated: Secondary | ICD-10-CM | POA: Diagnosis not present

## 2019-08-05 ENCOUNTER — Other Ambulatory Visit (HOSPITAL_COMMUNITY): Payer: 59 | Admitting: Licensed Clinical Social Worker

## 2019-08-05 ENCOUNTER — Other Ambulatory Visit (HOSPITAL_COMMUNITY): Payer: 59 | Admitting: Occupational Therapy

## 2019-08-05 ENCOUNTER — Encounter (HOSPITAL_COMMUNITY): Payer: Self-pay | Admitting: Occupational Therapy

## 2019-08-05 ENCOUNTER — Other Ambulatory Visit: Payer: Self-pay

## 2019-08-05 DIAGNOSIS — F333 Major depressive disorder, recurrent, severe with psychotic symptoms: Secondary | ICD-10-CM

## 2019-08-05 DIAGNOSIS — R4589 Other symptoms and signs involving emotional state: Secondary | ICD-10-CM

## 2019-08-05 DIAGNOSIS — F0630 Mood disorder due to known physiological condition, unspecified: Secondary | ICD-10-CM

## 2019-08-05 DIAGNOSIS — F4325 Adjustment disorder with mixed disturbance of emotions and conduct: Secondary | ICD-10-CM

## 2019-08-05 DIAGNOSIS — F329 Major depressive disorder, single episode, unspecified: Secondary | ICD-10-CM | POA: Diagnosis not present
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
## 2019-08-05 NOTE — Therapy (Signed)
[facility] [address] [place], [place], [id number] Phone: [phone]   Fax:  [phone]  Occupational Therapy Evaluation  Patient Details  Name: Jimmy FeelingDominance Barr Herbster MRN: [id number] Date of Birth: September 09, 1991 Referring Provider (OT): Hillery Jacksanika Lewis, NP   Virtual Visit via Video Note  I connected with Jimmy Barr Ney on 08/05/19 at  8:00 AM EST by a video enabled telemedicine application and verified that I am speaking with the correct person using two identifiers.   I discussed the limitations of evaluation and management by telemedicine and the availability of in person appointments. The patient expressed understanding and agreed to proceed.   I discussed the assessment and treatment plan with the patient. The patient was provided an opportunity to ask questions and all were answered. The patient agreed with the plan and demonstrated an understanding of the instructions.   The patient was advised to call back or seek an in-person evaluation if the symptoms worsen or if the condition fails to improve as anticipated.  I provided 90 minutes of non-face-to-face time during this encounter.   Dalphine HandingKaylee Khia Dieterich, OT    Encounter Date: 08/05/2019  OT End of Session - 08/05/19 1522    Visit Number  1    Number of Visits  12    Date for OT Re-Evaluation  09/02/19    Authorization Type  [employer]    OT Start Time  1100    OT Stop Time  1230    OT Time Calculation (min)  90 min    Activity Tolerance  Patient tolerated treatment well    Behavior During Therapy  [facility]WFL for tasks assessed/performed       History reviewed. No pertinent past medical history.  History reviewed. No pertinent surgical history.  There were no vitals filed for this visit.  Subjective Assessment - 08/05/19 1519    Currently in Pain?  No/denies        [facility] - 08/05/19 0001      Assessment   Medical Diagnosis  Severe episode of major  depressive disorder with psychotic features    Referring Provider (OT)  [place] Jacksanika Lewis, NP    Onset Date/Surgical Date  08/05/19      Precautions   Precautions  None      Restrictions   Weight Bearing Restrictions  No      Balance Screen   Has the patient fallen in the past 6 months  No    Has the patient had a decrease in activity level because of a fear of falling?   No    Is the patient reluctant to leave their home because of a fear of falling?   No           OT assessment: OCAIRS  Diagnosis: MDD severe with psychotic features  Past medical history/referral information: [facility] for passive HI and SI. 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  Leisure: decreased engagement  Social support: isolating, reports having trust and abandonment issues  Financial traderCAIRS Mental Health Interview Summary of Client Scores:  Social workerACILITATES PARTICIPATION IN OCCUPATION  ALLOWS PARTICIPATION IN OCCUPATION INHIBITS  PARTICIPATION IN OCCUPATION RESTRICTS PARTICIPATION IN OCCUPATION COMMENTS  ROLES                X    HABITS                X    PERSONAL CAUSATION                 X   VALUES                X    INTERESTS                X    SKILLS                X    SHORT TERM GOALS                X    LONG TERM GOALS                X    INTERPETATION OF PAST EXPERIENCES                 X   PHYSICAL ENVIRONMENT                X    SOCIAL ENVIRONMENT                 X   READINESS FOR CHANGE                X      Need for Occupational Therapy:  4 Shows positive occupational participation, no need for OT.   3 Need for minimal  intervention/consultative participation    X 2 Need for OT intervention indicated to restore/improve participation   1 Need for extensive OT intervention indicated to improve participation.  Referral for follow up services also recommended.    Assessment:  Patient demonstrates behavior that inhibits participation in occupation.  Patient will benefit from occupational therapy intervention in order to improve time management, financial management, stress management, job readiness skills, social skills, and health management skills in preparation to return to full time community living and to be a productive community member.    Plan:  Patient will participate in skilled occupational therapy sessions individually or in a group setting to improve coping skills, psychosocial skills, and emotional skills required to return to prior level of function.  Treatment will be 4-5 times per week for 3 weeks.       OT TREATMENT  S: My work can be stressful   O: Education given on coping strategies to combat workplace stress. Pt asked to identify most stressful aspect of job and how that effects them mentally and physically. Common sources of workplace stress handout discussed with pt and other group members. Stress management at work identified with: keeping a stress journal, developing healthy responses, establishing boundaries, take time to recharge, learn how to relax, talk to your supervisor, getting some support. Pt encouraged to identify area which they need most practice.  A: Pt presents with blunted affect, engaged and participatory. He shares how his work can be stressful, and that it would be helpful to implement the use of a safe space in his daily work routine.  P: OT group will be x3 per week while pt in PHP     OT Education - 08/05/19 1522    Education Details  education given on work place stress    Person(s) Educated  Patient  Methods  Explanation;Handout    Comprehension  Verbalized  understanding       OT Short Term Goals - 08/05/19 1523      OT SHORT TERM GOAL #1   Title  Pt will be educated on strategies to improve psychosocial skills needed to participate fully in all daily, work, and leisure activities    Time  4    Period  Weeks    Status  New    Target Date  09/02/19      OT SHORT TERM GOAL #2   Title  Pt will apply psychosocial skills and coping mechanisms to daily activities in order to function independently and reintegrate into community    Time  4    Period  Weeks    Status  New      OT SHORT TERM GOAL #3   Title  Pt will recall and/or apply 1-3 sleep hygiene strategies to improve function in BADL routine upon reintegrating into community    Time  4    Period  Weeks    Status  New      OT SHORT TERM GOAL #4   Title  Pt will engage in goal setting to improve functional BADL/IADL routine upon reintegrating into community.    Time  4    Period  Weeks    Status  New               Plan - 08/05/19 1523    OT Occupational Profile and History  Detailed Assessment- Review of Records and additional review of physical, cognitive, psychosocial history related to current functional performance    Occupational performance deficits (Please refer to evaluation for details):  ADL's;IADL's;Rest and Sleep;Work;Leisure;Social Participation    Body Structure / Function / Physical Skills  ADL;IADL    Cognitive Skills  Energy/Drive    Psychosocial Skills  Coping Strategies;Interpersonal Interaction;Routines and Behaviors;Habits    Rehab Potential  Good    Clinical Decision Making  Limited treatment options, no task modification necessary    Comorbidities Affecting Occupational Performance:  None    Modification or Assistance to Complete Evaluation   No modification of tasks or assist necessary to complete eval    OT Frequency  3x / week    OT Duration  4 weeks    OT Treatment/Interventions  Psychosocial skills training;Coping strategies  training;Self-care/ADL training;Other (comment)   community reintegration   Consulted and Agree with Plan of Care  Patient       Patient will benefit from skilled therapeutic intervention in order to improve the following deficits and impairments:   Body Structure / Function / Physical Skills: ADL, IADL Cognitive Skills: Energy/Drive Psychosocial Skills: Coping Strategies, Interpersonal Interaction, Routines and Behaviors, Habits   Visit Diagnosis: Severe episode of recurrent major depressive disorder, with psychotic features (HCC)  Difficulty coping    Problem List Patient Active Problem List   Diagnosis Date Noted  . Major depressive disorder, recurrent episode, severe (HCC) 08/04/2019  . Homicidal ideation 08/01/2019  . Adjustment disorder with mixed disturbance of emotions and conduct 08/01/2019   Dalphine Handing, MSOT, OTR/L Behavioral Health OT/ Acute Relief OT PHP Office: [phone]  Dalphine Handing 08/05/2019, 3:30 PM  [facility] HOSPITALIZATION PROGRAM [address] [place], [place], [address] Phone: [phone]   Fax:  [phone]  Name: REYLI SCHROTH MRN: [id number] Date of Birth: November 23, 1990

## 2019-08-05 NOTE — Psych (Signed)
Virtual Visit via Video Note  I connected with Jimmy Barr on 08/05/19 at  9:00 AM EST by a video enabled telemedicine application and verified that I am speaking with the correct person using two identifiers.   I discussed the limitations of evaluation and management by telemedicine and the availability of in person appointments. The patient expressed understanding and agreed to proceed.    I discussed the assessment and treatment plan with the patient. The patient was provided an opportunity to ask questions and all were answered. The patient agreed with the plan and demonstrated an understanding of the instructions.   The patient was advised to call back or seek an in-person evaluation if the symptoms worsen or if the condition fails to improve as anticipated.  I provided 10 minutes of non-face-to-face time during this encounter.  Patient virtually agrees to treatment plan.  Royetta Crochet, [facility]
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
## 2019-08-08 ENCOUNTER — Other Ambulatory Visit: Payer: Self-pay

## 2019-08-08 ENCOUNTER — Other Ambulatory Visit (HOSPITAL_COMMUNITY): Payer: 59 | Admitting: Licensed Clinical Social Worker

## 2019-08-08 ENCOUNTER — Encounter (HOSPITAL_COMMUNITY): Payer: Self-pay

## 2019-08-08 DIAGNOSIS — F329 Major depressive disorder, single episode, unspecified: Secondary | ICD-10-CM | POA: Diagnosis not present

## 2019-08-08 DIAGNOSIS — F333 Major depressive disorder, recurrent, severe with psychotic symptoms: Secondary | ICD-10-CM
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
## 2019-08-09 ENCOUNTER — Other Ambulatory Visit: Payer: Self-pay

## 2019-08-09 ENCOUNTER — Other Ambulatory Visit (HOSPITAL_COMMUNITY): Payer: 59 | Admitting: Licensed Clinical Social Worker

## 2019-08-09 ENCOUNTER — Other Ambulatory Visit (HOSPITAL_COMMUNITY): Payer: 59 | Admitting: Occupational Therapy

## 2019-08-09 DIAGNOSIS — F0630 Mood disorder due to known physiological condition, unspecified: Secondary | ICD-10-CM

## 2019-08-09 DIAGNOSIS — F333 Major depressive disorder, recurrent, severe with psychotic symptoms: Secondary | ICD-10-CM

## 2019-08-09 DIAGNOSIS — R4589 Other symptoms and signs involving emotional state: Secondary | ICD-10-CM

## 2019-08-09 DIAGNOSIS — F329 Major depressive disorder, single episode, unspecified: Secondary | ICD-10-CM | POA: Diagnosis not present

## 2019-08-09 DIAGNOSIS — F4325 Adjustment disorder with mixed disturbance of emotions and conduct: Secondary | ICD-10-CM

## 2019-08-09 MED ORDER — HYDROXYZINE PAMOATE 25 MG PO CAPS: 25.0000 mg | ORAL_CAPSULE | Freq: Three times a day (TID) | ORAL | 0 refills | Status: DC | PRN

## 2019-08-09 MED ORDER — QUETIAPINE FUMARATE 25 MG PO TABS: 25.0000 mg | ORAL_TABLET | Freq: Two times a day (BID) | ORAL | 1 refills | Status: DC

## 2019-08-09 NOTE — Progress Notes (Signed)
Virtual Visit via Video Note  I connected with Jimmy Barr on 08/09/19 at  9:00 AM EST by a video enabled telemedicine application and verified that I am speaking with the correct person using two identifiers.   I discussed the limitations of evaluation and management by telemedicine and the availability of in person appointments. The patient expressed understanding and agreed to proceed.   I discussed the assessment and treatment plan with the patient. The patient was provided an opportunity to ask questions and all were answered. The patient agreed with the plan and demonstrated an understanding of the instructions.   The patient was advised to call back or seek an in-person evaluation if the symptoms worsen or if the condition fails to improve as anticipated.  I provided 15 minutes of non-face-to-face time during this encounter.   Derrill Center, NP   [facility] MD/PA/NP OP Progress Note  08/10/2019 12:53 PM Jimmy Barr  MRN:  [id number]   Evaluation: Jimmy Barr seen via teleassessment.  He presents irritable and distracted during this assessment.  As he Barr " I am riding around looking for my neighbors car."  Patient continues to Barr ongoing paranoia regarding his fiance sleeping with a neighbor.  Patient states " I know that my thoughts are irrational however my anxiety is high right now."  Jimmy Barr racing thoughts. Patient stated " I am not sure if I am being set up."  Barr interrupted sleep.  Rates his depression 6 out of 10 with 10 being the worst.  Barr anxiety 10 out of 10 with 10 being the worst related to current stressors.  Discussed initiating Seroquel 25 mg p.o. twice daily and 50 mg p.o. nightly.  Will refill Vistaril 25 mg as needed.  Patient appeared receptive to plan we will continue to monitor for safety.  Patient appears to have great insight regarding current situation related to girlfriend cheating behaviors.  Denying suicidal or homicidal  ideations.  Denies auditory or visual hallucinations.  Patient Barr minimal support related to his current situation.  States "that he has been residing with his friends, however they take my situation as a joke. " Support, encouragement and reassurance was provided.  Visit Diagnosis:    ICD-10-CM   1. Severe episode of recurrent major depressive disorder, with psychotic features ([place])  F33.3   2. Mood disorder in conditions classified elsewhere  F06.30   3. Adjustment disorder with mixed disturbance of emotions and conduct  F43.25     Past Psychiatric History:   Past Medical History: No past medical history on file. No past surgical history on file.  Family Psychiatric History:   Family History: No family history on file.  Social History:  Social History   Socioeconomic History  . Marital status: Single    Spouse name: Not on file  . Number of children: Not on file  . Years of education: Not on file  . Highest education level: Not on file  Occupational History  . Not on file  Social Needs  . Financial resource strain: Not very hard  . Food insecurity    Worry: Never true    Inability: Never true  . Transportation needs    Medical: No    Non-medical: No  Tobacco Use  . Smoking status: Current Some Day Smoker    Packs/day: 0.10  . Smokeless tobacco: Never Used  Substance and Sexual Activity  . Alcohol use: Yes    Comment: social  . Drug use: Yes  Types: Marijuana  . Sexual activity: Yes  Lifestyle  . Physical activity    Days per week: 0 days    Minutes per session: Not on file  . Stress: Very much  Relationships  . Social Musicianconnections    Talks on phone: Twice a week    Gets together: More than three times a week    Attends religious service: 1 to 4 times per year    Active member of club or organization: No    Attends meetings of clubs or organizations: Never    Relationship status: Never married  Other Topics Concern  . Not on file  Social History  Narrative  . Not on file    Allergies: No Known Allergies  Metabolic Disorder Labs: No results found for: HGBA1C, MPG No results found for: PROLACTIN No results found for: CHOL, TRIG, HDL, CHOLHDL, VLDL, LDLCALC No results found for: TSH  Therapeutic Level Labs: No results found for: LITHIUM No results found for: VALPROATE No components found for:  CBMZ  Current Medications: Current Outpatient Medications  Medication Sig Dispense Refill  . hydrOXYzine (VISTARIL) 25 MG capsule Take 1 capsule (25 mg total) by mouth 3 (three) times daily as needed. 60 capsule 0  . QUEtiapine (SEROQUEL) 25 MG tablet Take 1 tablet (25 mg total) by mouth 2 (two) times daily. And take 2 tablets  (50 mg total) by mouth at night for mood stabilization 60 tablet 1  . traZODone (DESYREL) 50 MG tablet Take 1 tablet (50 mg total) by mouth at bedtime as needed for sleep. 30 tablet 0   No current facility-administered medications for this visit.      Musculoskeletal:  Psychiatric Specialty Exam: ROS  There were no vitals taken for this visit.There is no height or weight on file to calculate BMI.  General Appearance: Casual  Eye Contact:  Good  Speech:  Clear and Coherent  Volume:  Normal  Mood:  Anxious and Depressed  Affect:  Congruent  Thought Process:  Coherent  Orientation:  Full (Time, Place, and Person)  Thought Content: Logical   Suicidal Thoughts:  No  Homicidal Thoughts:  No  Memory:  Immediate;   Fair Recent;   Fair  Judgement:  Intact  Insight:  Present  Psychomotor Activity:  Normal  Concentration:  Concentration: Poor  Recall:  [employer]iservFair  Fund of Knowledge: Fair  Language: Fair  Akathisia:  No  Handed:  Right  AIMS (if indicated):   Assets:  Communication Skills Housing Social Support  ADL's:  Intact  Cognition: WNL  Sleep:  Fair   Screenings: AIMS     Admission (Discharged) from OP Visit from 08/01/2019 in BEHAVIORAL HEALTH OBSERVATION UNIT Admission (Discharged) from  06/26/2019 in BEHAVIORAL HEALTH OBSERVATION UNIT  AIMS Total Score  0  0    AUDIT     Admission (Discharged) from OP Visit from 08/01/2019 in BEHAVIORAL HEALTH OBSERVATION UNIT Admission (Discharged) from 06/26/2019 in BEHAVIORAL HEALTH OBSERVATION UNIT  Alcohol Use Disorder Identification Test Final Score (AUDIT)  3  2    PHQ2-9     Counselor from 08/08/2019 in BEHAVIORAL HEALTH PARTIAL HOSPITALIZATION PROGRAM  PHQ-2 Total Score  5  PHQ-9 Total Score  19       Assessment and Plan:  Continue partial hospitalization programming -Initiated Seroquel 25 mg p.o. twice daily and 50 mg p.o. nightly we will consider titrate pending medication follow-up.  Treatment plan was reviewed and agreed upon by NP T. Aqeel Norgaard and patient Chad Sligh's need  for continued group services   Oneta Rack, NP 08/10/2019, 12:53 PM

## 2019-08-10 ENCOUNTER — Encounter (HOSPITAL_COMMUNITY): Payer: Self-pay

## 2019-08-10 ENCOUNTER — Encounter (HOSPITAL_COMMUNITY): Payer: Self-pay | Admitting: Family

## 2019-08-10 ENCOUNTER — Other Ambulatory Visit: Payer: Self-pay

## 2019-08-10 ENCOUNTER — Other Ambulatory Visit (HOSPITAL_COMMUNITY): Payer: 59 | Admitting: Licensed Clinical Social Worker

## 2019-08-10 DIAGNOSIS — F333 Major depressive disorder, recurrent, severe with psychotic symptoms: Secondary | ICD-10-CM

## 2019-08-10 DIAGNOSIS — F329 Major depressive disorder, single episode, unspecified: Secondary | ICD-10-CM | POA: Diagnosis not present

## 2019-08-10 NOTE — Therapy (Signed)
[facility] PARTIAL HOSPITALIZATION PROGRAM [address] [place], [place], [address] Phone: [phone]   Fax:  [phone]  Occupational Therapy Treatment  Patient Details  Name: Jimmy Barr MRN: [id number] Date of Birth: January 07, 1991 Referring Provider (OT): Hillery Jacks, NP  Virtual Visit via Video Note  I connected with Jimmy Barr on 08/10/19 at  8:00 AM EST by a video enabled telemedicine application and verified that I am speaking with the correct person using two identifiers.   I discussed the limitations of evaluation and management by telemedicine and the availability of in person appointments. The patient expressed understanding and agreed to proceed.  I discussed the assessment and treatment plan with the patient. The patient was provided an opportunity to ask questions and all were answered. The patient agreed with the plan and demonstrated an understanding of the instructions.   The patient was advised to call back or seek an in-person evaluation if the symptoms worsen or if the condition fails to improve as anticipated.  I provided 60 minutes of non-face-to-face time during this encounter.   Dalphine Handing, OT    Encounter Date: 08/09/2019  OT End of Session - 08/10/19 1118    Visit Number  2    Number of Visits  12    Date for OT Re-Evaluation  09/02/19    Authorization Type  [employer]    OT Start Time  1200    OT Stop Time  1300    OT Time Calculation (min)  60 min    Activity Tolerance  Patient tolerated treatment well    Behavior During Therapy  [facility] for tasks assessed/performed       History reviewed. No pertinent past medical history.  History reviewed. No pertinent surgical history.  There were no vitals filed for this visit.  Subjective Assessment - 08/10/19 1117    Currently in Pain?  No/denies         S: My self esteem is low  O: Education given on self esteem and how it relates to daily experiences.  Pt asked to give definition of self esteem, and current rating of self esteem. Positive and negative contributors of self esteem to be brainstormed within group in relation to personal experiences.  A: Pt presents with blunted affect, engaged and participatory. He shares how his self esteem is low after his current break up. He states he would like to learn skills to improve this. He shares that he often gets caught up in judgements from others.  P: OT group will be x3 per week while pt in PHP            OT Education - 08/10/19 1117    Education Details  education given on self esteem    Person(s) Educated  Patient    Methods  Explanation;Handout    Comprehension  Verbalized understanding       OT Short Term Goals - 08/10/19 1118      OT SHORT TERM GOAL #1   Title  Pt will be educated on strategies to improve psychosocial skills needed to participate fully in all daily, work, and leisure activities    Time  4    Period  Weeks    Status  On-going    Target Date  09/02/19      OT SHORT TERM GOAL #2   Title  Pt will apply psychosocial skills and coping mechanisms to daily activities in order to function independently and reintegrate into  community    Time  4    Period  Weeks    Status  On-going      OT SHORT TERM GOAL #3   Title  Pt will recall and/or apply 1-3 sleep hygiene strategies to improve function in BADL routine upon reintegrating into community    Time  4    Period  Weeks    Status  On-going      OT SHORT TERM GOAL #4   Title  Pt will engage in goal setting to improve functional BADL/IADL routine upon reintegrating into community.    Time  4    Period  Weeks    Status  On-going               Plan - 08/10/19 1118    Occupational performance deficits (Please refer to evaluation for details):  ADL's;IADL's;Rest and Sleep;Work;Leisure;Social Participation    Body Structure / Function / Physical Skills  ADL;IADL    Cognitive Skills  Energy/Drive     Psychosocial Skills  Coping Strategies;Interpersonal Interaction;Routines and Behaviors;Habits       Patient will benefit from skilled therapeutic intervention in order to improve the following deficits and impairments:   Body Structure / Function / Physical Skills: ADL, IADL Cognitive Skills: Energy/Drive Psychosocial Skills: Coping Strategies, Interpersonal Interaction, Routines and Behaviors, Habits   Visit Diagnosis: Severe episode of recurrent major depressive disorder, with psychotic features ([place])  Difficulty coping    Problem List Patient Active Problem List   Diagnosis Date Noted  . Major depressive disorder, recurrent episode, severe ([place]) 08/04/2019  . Homicidal ideation 08/01/2019  . Adjustment disorder with mixed disturbance of emotions and conduct 08/01/2019   Zenovia Jarred, MSOT, OTR/L Behavioral Health OT/ Acute Relief OT PHP Office: [place] 08/10/2019, 11:19 AM  [facility] HOSPITALIZATION PROGRAM [place], [place], [address] Phone: [phone]   Fax:  [phone]  Name: Jimmy Barr MRN: [id number] Date of Birth: November 16, 1990

## 2019-08-11 ENCOUNTER — Encounter (HOSPITAL_COMMUNITY): Payer: Self-pay

## 2019-08-11 ENCOUNTER — Other Ambulatory Visit: Payer: Self-pay

## 2019-08-11 ENCOUNTER — Other Ambulatory Visit (HOSPITAL_COMMUNITY): Payer: 59 | Admitting: Licensed Clinical Social Worker

## 2019-08-11 ENCOUNTER — Other Ambulatory Visit (HOSPITAL_COMMUNITY): Payer: 59 | Admitting: Occupational Therapy

## 2019-08-11 DIAGNOSIS — R4589 Other symptoms and signs involving emotional state: Secondary | ICD-10-CM

## 2019-08-11 DIAGNOSIS — F333 Major depressive disorder, recurrent, severe with psychotic symptoms: Secondary | ICD-10-CM

## 2019-08-11 DIAGNOSIS — F329 Major depressive disorder, single episode, unspecified: Secondary | ICD-10-CM | POA: Diagnosis not present

## 2019-08-11 NOTE — Therapy (Signed)
[place], [place], [address] Phone: [phone]   Fax:  [phone]  Occupational Therapy Treatment  Patient Details  Name: Jimmy Barr MRN: [id number] Date of Birth: [birth year] Referring Provider (OT): Ricky Ala, NP  Virtual Visit via Video Note  I connected with Jimmy Barr on 08/11/19 at  8:00 AM EST by a video enabled telemedicine application and verified that I am speaking with the correct person using two identifiers.   I discussed the limitations of evaluation and management by telemedicine and the availability of in person appointments. The patient expressed understanding and agreed to proceed.   I discussed the assessment and treatment plan with the patient. The patient was provided an opportunity to ask questions and all were answered. The patient agreed with the plan and demonstrated an understanding of the instructions.   The patient was advised to call back or seek an in-person evaluation if the symptoms worsen or if the condition fails to improve as anticipated.  I provided 60 minutes of non-face-to-face time during this encounter.   Zenovia Jarred, OT    Encounter Date: 08/11/2019  OT End of Session - 08/11/19 1658    Visit Number  3    Number of Visits  12    Date for OT Re-Evaluation  09/02/19    Authorization Type  [employer]    OT Start Time  1200    OT Stop Time  1300    OT Time Calculation (min)  60 min    Activity Tolerance  Patient tolerated treatment well    Behavior During Therapy  [facility] for tasks assessed/performed       History reviewed. No pertinent past medical history.  History reviewed. No pertinent surgical history.  There were no vitals filed for this visit.  Subjective Assessment - 08/11/19 1657    Currently in Pain?  No/denies          S: I am looking to learn more about self esteem and stress management combined   O: Stress  management group completed to use as productive coping strategy, to help mitigate maladaptive coping to integrate in functional BADL/IADL. Education given on the definition of stress and its cognitive, behavioral, emotional, and physical effects on the body. Stress symptom checklist completed to raise insight on current symptoms felt with stress. Stress management tool worksheet discussed to educate on unhealthy vs healthy coping skills to manage stress to improve community integration. Coping strategies taught include: relaxation based- deep breathing, counting to 10, taking a 1 minute vacation, acceptance, stress balls, relaxation audio/video, visual/mental imagery. Positive mental attitude- gratitude, acceptance, cognitive reframing, positive self talk, anger management.   A: Pt presents with blunted affect, engaged and participatory. States that he has low self esteem and wants to learn skills to improve this as well as manage stress. Pt shares that he most often suppresses his stress and punches walls to manage. He states that exercise helps to manage this. Continued skills to be given next date.  P: OT group to be x3 per week while pt in PHP             OT Education - 08/11/19 1657    Education Details  education given on stress management    Person(s) Educated  Patient    Methods  Explanation;Handout    Comprehension  Verbalized understanding       OT Short Term Goals - 08/10/19 1118  OT SHORT TERM GOAL #1   Title  Pt will be educated on strategies to improve psychosocial skills needed to participate fully in all daily, work, and leisure activities    Time  4    Period  Weeks    Status  On-going    Target Date  09/02/19      OT SHORT TERM GOAL #2   Title  Pt will apply psychosocial skills and coping mechanisms to daily activities in order to function independently and reintegrate into community    Time  4    Period  Weeks    Status  On-going      OT SHORT TERM GOAL  #3   Title  Pt will recall and/or apply 1-3 sleep hygiene strategies to improve function in BADL routine upon reintegrating into community    Time  4    Period  Weeks    Status  On-going      OT SHORT TERM GOAL #4   Title  Pt will engage in goal setting to improve functional BADL/IADL routine upon reintegrating into community.    Time  4    Period  Weeks    Status  On-going               Plan - 08/11/19 1659    Occupational performance deficits (Please refer to evaluation for details):  ADL's;IADL's;Rest and Sleep;Work;Leisure;Social Participation    Body Structure / Function / Physical Skills  ADL;IADL    Cognitive Skills  Energy/Drive    Psychosocial Skills  Coping Strategies;Interpersonal Interaction;Routines and Behaviors;Habits       Patient will benefit from skilled therapeutic intervention in order to improve the following deficits and impairments:   Body Structure / Function / Physical Skills: ADL, IADL Cognitive Skills: Energy/Drive Psychosocial Skills: Coping Strategies, Interpersonal Interaction, Routines and Behaviors, Habits   Visit Diagnosis: Severe episode of recurrent major depressive disorder, with psychotic features (HCC)  Difficulty coping    Problem List Patient Active Problem List   Diagnosis Date Noted  . Major depressive disorder, recurrent episode, severe (HCC) 08/04/2019  . Homicidal ideation 08/01/2019  . Adjustment disorder with mixed disturbance of emotions and conduct 08/01/2019   Dalphine Handing, MSOT, OTR/L Behavioral Health OT/ Acute Relief OT PHP Office: [phone]  Dalphine Handing 08/11/2019, 4:59 PM  [facility] HOSPITALIZATION PROGRAM [address] [place], [place], [address] Phone: [phone]   Fax:  [phone]  Name: Jimmy Barr MRN: [id number] Date of Birth: [birth year]

## 2019-08-12 ENCOUNTER — Other Ambulatory Visit: Payer: Self-pay

## 2019-08-12 ENCOUNTER — Other Ambulatory Visit (HOSPITAL_COMMUNITY): Payer: 59

## 2019-08-12 ENCOUNTER — Other Ambulatory Visit (HOSPITAL_COMMUNITY): Payer: 59 | Admitting: Licensed Clinical Social Worker

## 2019-08-12 DIAGNOSIS — F329 Major depressive disorder, single episode, unspecified: Secondary | ICD-10-CM | POA: Diagnosis not present

## 2019-08-12 DIAGNOSIS — F333 Major depressive disorder, recurrent, severe with psychotic symptoms: Secondary | ICD-10-CM

## 2019-08-15 ENCOUNTER — Other Ambulatory Visit: Payer: Self-pay

## 2019-08-15 ENCOUNTER — Other Ambulatory Visit (HOSPITAL_COMMUNITY): Payer: 59 | Admitting: Licensed Clinical Social Worker

## 2019-08-15 DIAGNOSIS — F329 Major depressive disorder, single episode, unspecified: Secondary | ICD-10-CM | POA: Diagnosis not present

## 2019-08-15 DIAGNOSIS — F333 Major depressive disorder, recurrent, severe with psychotic symptoms: Secondary | ICD-10-CM

## 2019-08-15 NOTE — Progress Notes (Signed)
Spoke with patient via Webex video call, used 2 identifiers to correctly identify patient. States that groups are going well and he is learning a lot. Feels like his medications are working well for him and no side effects noted. On scale 1-10 as 10 being worst he rates depression at 1 and anxiety at 3. Denies SI/HI or AV hallucinations. No issues or complaints.

## 2019-08-16 ENCOUNTER — Other Ambulatory Visit (HOSPITAL_COMMUNITY): Payer: 59 | Admitting: Licensed Clinical Social Worker

## 2019-08-16 ENCOUNTER — Other Ambulatory Visit (HOSPITAL_COMMUNITY): Payer: 59

## 2019-08-16 ENCOUNTER — Other Ambulatory Visit: Payer: Self-pay

## 2019-08-16 DIAGNOSIS — F333 Major depressive disorder, recurrent, severe with psychotic symptoms: Secondary | ICD-10-CM

## 2019-08-16 DIAGNOSIS — R4589 Other symptoms and signs involving emotional state: Secondary | ICD-10-CM

## 2019-08-16 DIAGNOSIS — F329 Major depressive disorder, single episode, unspecified: Secondary | ICD-10-CM | POA: Diagnosis not present

## 2019-08-16 NOTE — Psych (Signed)
Virtual Visit via Video Note  I connected with Jimmy Barr on 08/05/19 at  9:00 AM EST by a video enabled telemedicine application and verified that I am speaking with the correct person using two identifiers.   I discussed the limitations of evaluation and management by telemedicine and the availability of in person appointments. The patient expressed understanding and agreed to proceed.   I discussed the assessment and treatment plan with the patient. The patient was provided an opportunity to ask questions and all were answered. The patient agreed with the plan and demonstrated an understanding of the instructions.   The patient was advised to call back or seek an in-person evaluation if the symptoms worsen or if the condition fails to improve as anticipated.  Pt wasprovided 240 minutes of non-face-to-face time during this encounter.   Donia Guiles, LCSW    [facility] PHP THERAPIST PROGRESS NOTE  Jimmy Barr [id number]  Session Time: 9:00 - 10:00  Participation Level: Active  Behavioral Response: CasualAlertDepressed  Type of Therapy: Group Therapy  Treatment Goals addressed: Coping  Interventions: CBT, DBT, Supportive and Reframing  Summary: Clinician led check-in regarding current stressors and situation, and review of patient completed daily inventory. Clinician utilized active listening and empathetic response and validated patient emotions. Clinician facilitated processing group on pertinent issues.   Therapist Response:Jimmy Barr is a 28 y.o. male who presents with depression and mood dysregulation symptoms.Patient arrived within time allowed and reports that he is feeling "ok." Patient rates hismood at a 7on a scale of 1-10 with 10 being great. Pt reports yesterday was difficult and he went to pick up more tings from his apartment and had a run-in with his ex which upset him. Pt states he also visited his mom and spent time at home. Pt struggles  with ruminations and paranoia. Pt able to process. Patient engaged in discussion.       Session Time: 10:00 -11:00  Participation Level: Active  Behavioral Response: CasualAlertDepressed  Type of Therapy: Group Therapy, psychoeducation, psychotherapy  Treatment Goals addressed: Coping  Interventions: CBT, DBT, Solution Focused, Supportive and Reframing  Summary: Cln led discussion on work stress and how to manage it when you have little control over the environment. Group brainstormed ways to determine what is within their control and how to make little adjustments to aid the work environment for themselves.    Therapist Response: Pt engaged in discussion and was able to identify ways he can adjust what is within his control at work.       Session Time: 11:00- 12:00  Participation Level: Active  Behavioral Response: CasualAlertDepressed  Type of Therapy: Group Therapy, Psychoeducation; Psychotherapy  Treatment Goals addressed: Coping  Interventions: CBT; Solution focused; Supportive; Reframing  Summary: Cln led discussion on ways to address mental health first aid for ourselves. Group viewed TED talk, "How to Practice Emotional First Aid" to aid discussion.    Therapist Response: Pt engaged in discussion and identifies protecting his self-esteem as one thing to work on to address his mental health first aid.        Session Time: 12:00 -1:00  Participation Level:Active  Behavioral Response:CasualAlertDepressed  Type of Therapy: Group Therapy, OT  Treatment Goals addressed: Coping  Interventions:Psychosocial skills training, Supportive,   Summary:12:00 - 12:50:Occupational Therapy group 12:50 -1:00 Clinician led check-out. Clinician assessed for immediate needs, medication compliance and efficacy, and safety concerns   Therapist Response:12:00 - 12:50:Patient engaged in group. See OT note.  12:50 - 1:00:  At  [place], patient rates his mood at a 8 on a scale of 1-10 with 10 being great.Pt states afternoon plans of getting a hair cut. Patient demonstrates someprogress as evidenced by participating in first group session.Patient denies SI/HI/self-harm at the end of group.     Suicidal/Homicidal: Nowithout intent/plan  Plan: Pt will continue in PHP while working to decrease depression symptoms, increase emotional regulation, and increase ability to manage symptoms in a healthy manner.   Diagnosis: Severe episode of recurrent major depressive disorder, with psychotic features ([place]) [F33.3]    1. Severe episode of recurrent major depressive disorder, with psychotic features ([place])   2. Mood disorder in conditions classified elsewhere   3. Adjustment disorder with mixed disturbance of emotions and conduct       Lorin Glass, LCSW 08/16/2019

## 2019-08-16 NOTE — Progress Notes (Signed)
Virtual Visit via Video Note  I connected with Jimmy Barr on 08/16/19 at  9:00 AM EST by a video enabled telemedicine application and verified that I am speaking with the correct person using two identifiers.   I discussed the limitations of evaluation and management by telemedicine and the availability of in person appointments. The patient expressed understanding and agreed to proceed.   I discussed the assessment and treatment plan with the patient. The patient was provided an opportunity to ask questions and all were answered. The patient agreed with the plan and demonstrated an understanding of the instructions.   The patient was advised to call back or seek an in-person evaluation if the symptoms worsen or if the condition fails to improve as anticipated.  I provided 15 minutes of non-face-to-face time during this encounter.   Oneta Rack, NP   [facility] MD/PA/NP OP Progress Note  08/16/2019 11:01 AM Jimmy Barr  MRN:  [id number]   Evaluation: Patient seen via teleassessment.  Jimmy Barr presents with a brighter affect that and is more engaged during this assessment than on previous evaluation.  Denying suicidal or homicidal ideations.  Denies auditory or visual hallucinations.  Jimmy Barr continues to endorse mild paranoia. Reports symptoms have improved sarting Seroquel.  Jimmy Barr denied medication side effects does report mild drowsiness however reports that has improved since starting medication.  Reports Jimmy Barr recently moved back to his apartment with his girlfriend and is utilizing distraction techniques.  Reports a good appetite. States Jimmy Barr is resting well throughout the night. Support, encouragement and reassurance was provided.  Visit Diagnosis: No diagnosis found.  Past Psychiatric History:   Past Medical History: No past medical history on file. No past surgical history on file.  Family Psychiatric History:   Family History: No family history on file.  Social History:   Social History   Socioeconomic History  . Marital status: Single    Spouse name: Not on file  . Number of children: Not on file  . Years of education: Not on file  . Highest education level: Not on file  Occupational History  . Not on file  Social Needs  . Financial resource strain: Not very hard  . Food insecurity    Worry: Never true    Inability: Never true  . Transportation needs    Medical: No    Non-medical: No  Tobacco Use  . Smoking status: Current Some Day Smoker    Packs/day: 0.10  . Smokeless tobacco: Never Used  Substance and Sexual Activity  . Alcohol use: Yes    Comment: social  . Drug use: Yes    Types: Marijuana  . Sexual activity: Yes  Lifestyle  . Physical activity    Days per week: 0 days    Minutes per session: Not on file  . Stress: Very much  Relationships  . Social Musician on phone: Twice a week    Gets together: More than three times a week    Attends religious service: 1 to 4 times per year    Active member of club or organization: No    Attends meetings of clubs or organizations: Never    Relationship status: Never married  Other Topics Concern  . Not on file  Social History Narrative  . Not on file    Allergies: No Known Allergies  Metabolic Disorder Labs: No results found for: HGBA1C, MPG No results found for: PROLACTIN No results found for: CHOL, TRIG, HDL, CHOLHDL,  VLDL, LDLCALC No results found for: TSH  Therapeutic Level Labs: No results found for: LITHIUM No results found for: VALPROATE No components found for:  CBMZ  Current Medications: Current Outpatient Medications  Medication Sig Dispense Refill  . hydrOXYzine (VISTARIL) 25 MG capsule Take 1 capsule (25 mg total) by mouth 3 (three) times daily as needed. 60 capsule 0  . QUEtiapine (SEROQUEL) 25 MG tablet Take 1 tablet (25 mg total) by mouth 2 (two) times daily. And take 2 tablets  (50 mg total) by mouth at night for mood stabilization 60 tablet 1  .  traZODone (DESYREL) 50 MG tablet Take 1 tablet (50 mg total) by mouth at bedtime as needed for sleep. 30 tablet 0   No current facility-administered medications for this visit.      Musculoskeletal: Strength & Muscle Tone: within normal limits Gait & Station: normal Patient leans: N/A  Psychiatric Specialty Exam: ROS  There were no vitals taken for this visit.There is no height or weight on file to calculate BMI.  General Appearance: Casual  Eye Contact:  Good  Speech:  Clear and Coherent  Volume:  Normal  Mood:  Depressed  Affect:  Congruent  Thought Process:  Coherent  Orientation:  Full (Time, Place, and Person)  Thought Content: Logical   Suicidal Thoughts:  No  Homicidal Thoughts:  No  Memory:  Immediate;   Fair Recent;   Fair  Judgement:  Fair  Insight:  Fair  Psychomotor Activity:  Normal  Concentration:  Concentration: Fair  Recall:  [employer] of Knowledge: Fair  Language: Fair  Akathisia:  No  Handed:  Right  AIMS (if indicated):  Assets:  Communication Skills Desire for Improvement Resilience Social Support  ADL's:  Intact  Cognition: WNL  Sleep:  Fair   Screenings: AIMS     Admission (Discharged) from OP Visit from 08/01/2019 in [place] Admission (Discharged) from 06/26/2019 in [place] Total Score  0  0    AUDIT     Admission (Discharged) from OP Visit from 08/01/2019 in [place] Admission (Discharged) from 06/26/2019 in [place]  Alcohol Use Disorder Identification Test Final Score (AUDIT)  3  2    PHQ2-9     Counselor from 08/08/2019 in [place]  PHQ-2 Total Score  5  PHQ-9 Total Score  19       Assessment and Plan:  Continue with partial hospitalization programming Continues on Seroquel 25 mg p.o. twice daily and 50 mg p.o.nightly  continue Vistaril 25 mg as needed  Treatment plan was  reviewed and agreed upon by NP T.  and patient Jimmy Barr discussed famotidine need for continued group services  Derrill Center, NP 08/16/2019, 11:01 AM

## 2019-08-17 ENCOUNTER — Other Ambulatory Visit (HOSPITAL_COMMUNITY): Payer: 59 | Admitting: Licensed Clinical Social Worker

## 2019-08-17 ENCOUNTER — Encounter (HOSPITAL_COMMUNITY): Payer: Self-pay | Admitting: Family

## 2019-08-17 ENCOUNTER — Other Ambulatory Visit: Payer: Self-pay

## 2019-08-17 DIAGNOSIS — F329 Major depressive disorder, single episode, unspecified: Secondary | ICD-10-CM | POA: Diagnosis not present

## 2019-08-17 DIAGNOSIS — F333 Major depressive disorder, recurrent, severe with psychotic symptoms: Secondary | ICD-10-CM

## 2019-08-22 ENCOUNTER — Other Ambulatory Visit: Payer: Self-pay

## 2019-08-22 ENCOUNTER — Other Ambulatory Visit (HOSPITAL_COMMUNITY): Payer: 59 | Admitting: Licensed Clinical Social Worker

## 2019-08-22 DIAGNOSIS — F333 Major depressive disorder, recurrent, severe with psychotic symptoms: Secondary | ICD-10-CM

## 2019-08-22 DIAGNOSIS — F329 Major depressive disorder, single episode, unspecified: Secondary | ICD-10-CM | POA: Diagnosis not present

## 2019-08-23 ENCOUNTER — Encounter (HOSPITAL_COMMUNITY): Payer: Self-pay | Admitting: Family

## 2019-08-23 ENCOUNTER — Other Ambulatory Visit (HOSPITAL_COMMUNITY): Payer: 59 | Attending: Psychiatry | Admitting: Occupational Therapy

## 2019-08-23 ENCOUNTER — Other Ambulatory Visit (HOSPITAL_COMMUNITY): Payer: 59 | Admitting: Licensed Clinical Social Worker

## 2019-08-23 ENCOUNTER — Other Ambulatory Visit: Payer: Self-pay

## 2019-08-23 ENCOUNTER — Telehealth (HOSPITAL_COMMUNITY): Payer: Self-pay | Admitting: Psychiatry

## 2019-08-23 DIAGNOSIS — Z79899 Other long term (current) drug therapy: Secondary | ICD-10-CM | POA: Diagnosis not present

## 2019-08-23 DIAGNOSIS — F333 Major depressive disorder, recurrent, severe with psychotic symptoms: Secondary | ICD-10-CM

## 2019-08-23 DIAGNOSIS — F419 Anxiety disorder, unspecified: Secondary | ICD-10-CM | POA: Diagnosis not present

## 2019-08-23 DIAGNOSIS — F17210 Nicotine dependence, cigarettes, uncomplicated: Secondary | ICD-10-CM | POA: Diagnosis not present

## 2019-08-23 DIAGNOSIS — F329 Major depressive disorder, single episode, unspecified: Secondary | ICD-10-CM | POA: Diagnosis present

## 2019-08-23 DIAGNOSIS — R4589 Other symptoms and signs involving emotional state: Secondary | ICD-10-CM

## 2019-08-23 DIAGNOSIS — F323 Major depressive disorder, single episode, severe with psychotic features: Secondary | ICD-10-CM | POA: Diagnosis not present
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
## 2019-08-23 NOTE — Telephone Encounter (Signed)
D:  Placed call to orient pt to MH-IOP, but there was no answer.  A:  Left vm with case manager's name and phone #.  Pt is scheduled to start tomorrow.
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
## 2019-08-23 NOTE — Psych (Signed)
Virtual Visit via Video Note  I connected with Jimmy Barr on 08/09/19 at  9:00 AM EST by a video enabled telemedicine application and verified that I am speaking with the correct person using two identifiers.   I discussed the limitations of evaluation and management by telemedicine and the availability of in person appointments. The patient expressed understanding and agreed to proceed.   I discussed the assessment and treatment plan with the patient. The patient was provided an opportunity to ask questions and all were answered. The patient agreed with the plan and demonstrated an understanding of the instructions.   The patient was advised to call back or seek an in-person evaluation if the symptoms worsen or if the condition fails to improve as anticipated.  Pt wasprovided 240 minutes of non-face-to-face time during this encounter.   Lorin Glass, LCSW    [facility] PHP THERAPIST PROGRESS NOTE  NEAL TRULSON [id number]  Session Time: 9:00 - 10:00  Participation Level: Active  Behavioral Response: CasualAlertDepressed  Type of Therapy: Group Therapy  Treatment Goals addressed: Coping  Interventions: CBT, DBT, Supportive and Reframing  Summary: Clinician led check-in regarding current stressors and situation, and review of patient completed daily inventory. Clinician utilized active listening and empathetic response and validated patient emotions. Clinician facilitated processing group on pertinent issues.   Therapist Response:Jimmy Barr is a 28 y.o. male who presents with depression and mood dysregulation symptoms.Patient arrived within time allowed and reports that he is feeling "bad." Patient rates hismood at a 2on a scale of 1-10 with 10 being great. Pt reports he is at his apartment again and that he is upset with himself because "I'm failing and falling into the same rabbit hole I always fall into." Pt reports feeling it is "inevitable" he gets back  with his ex even though he doesn't want to. Pt struggles with black and white thinking. Pt able to process. Patient engaged in discussion.       Session Time: 10:00 -11:00  Participation Level: Active  Behavioral Response: CasualAlertDepressed  Type of Therapy: Group Therapy, psychoeducation, psychotherapy  Treatment Goals addressed: Coping  Interventions: CBT, DBT, Solution Focused, Supportive and Reframing  Summary: Cln led discussion on perspective including how it is mutable, how to recognize that perspective is the struggle, and how to apply fact-checking. Group members identified perspective issues in their own lives.   Therapist Response: Pt engaged in discussion and successfully identified ways in which perspective is causing an issue in his life, specifically in regards to his love life.       Session Time: 11:00- 12:00  Participation Level: Active  Behavioral Response: CasualAlertDepressed  Type of Therapy: Group Therapy, Psychoeducation; Psychotherapy  Treatment Goals addressed: Coping  Interventions: CBT; Solution focused; Supportive; Reframing  Summary: Cln led activity "What I like about me." Group members were tasked to come up with 3 things they liked about themselves and discussed the ease/difficulty it took to come up with those traits, how much they believed them, and barriers to thinking positively about themselves.  Therapist Response: Pt engaged in discussion and lists the 3 things he likes about himself as "open-minded, compassionate, and creative."        Session Time: 12:00 -1:00  Participation Level:Active  Behavioral Response:CasualAlertDepressed  Type of Therapy: Group Therapy, OT  Treatment Goals addressed: Coping  Interventions:Psychosocial skills training, Supportive,   Summary:12:00 - 12:50:Occupational Therapy group 12:50 -1:00 Clinician led check-out. Clinician assessed for immediate needs,  medication compliance and efficacy,  and safety concerns   Therapist Response:12:00 - 12:50:Patient engaged in group. See OT note.  12:50 - 1:00: At check-out, patient rates his mood at a 6 on a scale of 1-10 with 10 being great.Pt states afternoon plans of leaving his apartment. Patient demonstrates someprogress as evidenced by increased engagement with group members.Patient denies SI/HI/self-harm at the end of group.     Suicidal/Homicidal: Nowithout intent/plan  Plan: Pt will continue in PHP while working to decrease depression symptoms, increase emotional regulation, and increase ability to manage symptoms in a healthy manner.   Diagnosis: Severe episode of recurrent major depressive disorder, with psychotic features (HCC) [F33.3]    1. Severe episode of recurrent major depressive disorder, with psychotic features (HCC)   2. Mood disorder in conditions classified elsewhere   3. Adjustment disorder with mixed disturbance of emotions and conduct       Donia Guiles, LCSW 08/23/2019

## 2019-08-24 ENCOUNTER — Other Ambulatory Visit: Payer: Self-pay

## 2019-08-24 ENCOUNTER — Telehealth (HOSPITAL_COMMUNITY): Payer: Self-pay | Admitting: Psychiatry

## 2019-08-24 ENCOUNTER — Other Ambulatory Visit (HOSPITAL_COMMUNITY): Payer: 59

## 2019-08-24 ENCOUNTER — Other Ambulatory Visit (HOSPITAL_COMMUNITY): Payer: 59 | Admitting: Psychiatry

## 2019-08-24 ENCOUNTER — Encounter (HOSPITAL_COMMUNITY): Payer: Self-pay | Admitting: Family

## 2019-08-24 DIAGNOSIS — F333 Major depressive disorder, recurrent, severe with psychotic symptoms: Secondary | ICD-10-CM

## 2019-08-24 DIAGNOSIS — F0630 Mood disorder due to known physiological condition, unspecified: Secondary | ICD-10-CM

## 2019-08-24 DIAGNOSIS — F323 Major depressive disorder, single episode, severe with psychotic features: Secondary | ICD-10-CM | POA: Diagnosis not present
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
## 2019-08-24 NOTE — Progress Notes (Signed)
Virtual Visit via Video Note  I connected with Jimmy Barr on 08/24/19 at  9:00 AM EST by a video enabled telemedicine application and verified that I am speaking with the correct person using two identifiers.  Location: Patient: Jimmy Barr Provider: Lise Auer, LCSW   I discussed the limitations of evaluation and management by telemedicine and the availability of in person appointments. The patient expressed understanding and agreed to proceed.  History of Present Illness: MDD and Mood Disorder   Observations/Objective: Case Manager checked in with all participants to review discharge dates, insurance authorizations, work-related documents and needs for the treatment team.   Counselor introduced our guest speaker, Einar Grad, [facility] Pharmacist, who shared about psychiatric medications, side effects, treatment considerations and how to communicate with medical professionals. Group Members asked questions and shared medication concerns. Counselor prompted group members to reference a worksheet called, "Body Scan" to jot down questions and concerns about their physical health in preparation for their upcoming appointments with medical professionals. Jimmy Barr was an active participant through information gathering. Counselor and guest PA Student encouraged routine medical check-ups, preparing for appointments, following up with recommendations and seeking specialist if needed.   Counselor processed current mood and functioning and discussed how participants spent their time since last session and if skills were applied. Today is Jimmy Barr's first session in IOP. He shared issues related to his current support system, daily functioning challenges, need for treatment, takeaways from [facility] and goals for IOP. Jimmy Barr would like to continue application and skill building for better managing mental health symptoms and "resetting life". Jimmy Barr presents with severe depression and high anxiety.  Counselor  provided strategies for Stress Management in the Workplace through two videos created by a MD and a Dr of Psychology. Group Members identified which strategies they plan to implement to achieve better work-life balance and in prevention of mental health breakdowns due to work-related stressors.   Group members shared their plans for the afternoon/evening, incorporating self-care and productivity activities to alleviate stress/anxiety. Jimmy Barr was unsure and unable to identify a self-care practice. He hopes to spend time with his best friend today.   Assessment and Plan: Counselor recommends that patient remains in IOP treatment to better manage mental health symptoms and continue to address treatment plan goals. Counselor recommends adherence to crisis/safety plan, taking medications as prescribed and following up with medical professionals if any issues arise.   Follow Up Instructions: Counselor will send Webex link for next session.    I discussed the assessment and treatment plan with the patient. The patient was provided an opportunity to ask questions and all were answered. The patient agreed with the plan and demonstrated an understanding of the instructions.   The patient was advised to call back or seek an in-person evaluation if the symptoms worsen or if the condition fails to improve as anticipated.  I provided 180 minutes of non-face-to-face time during this encounter.   Lise Auer, LCSW

## 2019-08-24 NOTE — Therapy (Signed)
[place], [place], [address] Phone: [phone]   Fax:  [phone]  Occupational Therapy Treatment  Patient Details  Name: Jimmy Barr MRN: [id number] Date of Birth: 06/26/1991 Referring Provider (OT): Ricky Ala, NP   Encounter Date: 08/23/2019    No past medical history on file.  No past surgical history on file.  There were no vitals filed for this visit.    Pt not present for OT session this date. Had shown to group, but left prior to OT group. Pt is to d/c this date to IOP. All OT needs met, goals updated below.                      OT Short Term Goals - 08/24/19 1225      OT SHORT TERM GOAL #1   Title  Pt will be educated on strategies to improve psychosocial skills needed to participate fully in all daily, work, and leisure activities    Time  4    Period  Weeks    Status  Achieved    Target Date  09/02/19      OT SHORT TERM GOAL #2   Title  Pt will apply psychosocial skills and coping mechanisms to daily activities in order to function independently and reintegrate into community    Time  4    Period  Weeks    Status  Achieved      OT SHORT TERM GOAL #3   Title  Pt will recall and/or apply 1-3 sleep hygiene strategies to improve function in BADL routine upon reintegrating into community    Time  4    Period  Weeks    Status  Partially Met      OT SHORT TERM GOAL #4   Title  Pt will engage in goal setting to improve functional BADL/IADL routine upon reintegrating into community.    Time  4    Period  Weeks    Status  Achieved                 Patient will benefit from skilled therapeutic intervention in order to improve the following deficits and impairments:           Visit Diagnosis: Severe episode of recurrent major depressive disorder, with psychotic features ([place])  Difficulty coping    Problem List Patient Active  Problem List   Diagnosis Date Noted  . Major depressive disorder, recurrent episode, severe ([place]) 08/04/2019  . Homicidal ideation 08/01/2019  . Adjustment disorder with mixed disturbance of emotions and conduct 08/01/2019   OCCUPATIONAL THERAPY DISCHARGE SUMMARY  Visits from Start of Care: 4  Current functional level related to goals / functional outcomes: Pt is stepping down to IOP level of care   Remaining deficits: Continuing to implement coping skills learned   Education / Equipment: Education given on psychosocial and coping skills as it applies to BADL/IADL reintegration back to community  Plan: Patient agrees to discharge.  Patient goals were partially met. Patient is being discharged due to being pleased with the current functional level.  ?????       Zenovia Jarred, MSOT, OTR/L Behavioral Health OT/ Acute Relief OT PHP Office: [place] 08/24/2019, 12:26 PM  [facility] HOSPITALIZATION PROGRAM [place], [place], [address] Phone: [phone]   Fax:  [phone]  Name: Jimmy Barr MRN: [id number] Date of  Birth: [birth year]
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
## 2019-08-25 ENCOUNTER — Ambulatory Visit (HOSPITAL_COMMUNITY): Payer: 59

## 2019-08-25 ENCOUNTER — Other Ambulatory Visit: Payer: Self-pay

## 2019-08-25 ENCOUNTER — Other Ambulatory Visit (HOSPITAL_COMMUNITY): Payer: 59 | Admitting: Psychiatry

## 2019-08-25 ENCOUNTER — Encounter (HOSPITAL_COMMUNITY): Payer: Self-pay

## 2019-08-25 ENCOUNTER — Other Ambulatory Visit (HOSPITAL_COMMUNITY): Payer: 59

## 2019-08-25 DIAGNOSIS — F333 Major depressive disorder, recurrent, severe with psychotic symptoms: Secondary | ICD-10-CM

## 2019-08-25 DIAGNOSIS — F323 Major depressive disorder, single episode, severe with psychotic features: Secondary | ICD-10-CM | POA: Diagnosis not present

## 2019-08-25 NOTE — Progress Notes (Signed)
Virtual Visit via Video Note  I connected with Jimmy Barr on 08/25/19 at  9:00 AM EST by a video enabled telemedicine application and verified that I am speaking with the correct person using two identifiers.  Location: Patient: Jimmy Barr Provider: Lise Auer, LCSW   I discussed the limitations of evaluation and management by telemedicine and the availability of in person appointments. The patient expressed understanding and agreed to proceed.  History of Present Illness: MDD  Observations/Objective: Case Manager checked in with all participants to review discharge dates, insurance authorizations, work-related documents and needs for the treatment team. Case manager introduced guest speaker, Jimmy Barr, [place], to facilitate a discussion around Grief and Loss topics. Patient participated in discussion and shared insights about their own needs regarding this topic. [name] opened up about his current life transitions and the loss that comes with broken relationships and the sense of starting over completely. Counselor allowed time for reflection and journaling on the topic of grief/loss and promoted continuing this work in individual therapy.   Counselor facilitated a brief check in with group members to gage mood and current functioning as well as their takeaways from the presentation. [name] presents with severe depression and high anxiety. Counselor engaged the group in an ice breaker to highlight positive affirmations as a way for cognitive coping and positive psychology.   Counselor introduced Field seismologist, Jimmy Barr, Yoga Instructor, to guide the group in a yoga practice. Counselor checked in with all participants to assess the benefits. [name] participated and enjoyed the benefits of the relaxation and connectivity with his body and mind.   Assessment and Plan: Counselor recommends that patient remains in IOP treatment to better manage mental health symptoms and  continue to address treatment plan goals. Counselor recommends adherence to crisis/safety plan, taking medications as prescribed and following up with medical professionals if any issues arise.   Follow Up Instructions: Counselor will send Webex link for next session.    I discussed the assessment and treatment plan with the patient. The patient was provided an opportunity to ask questions and all were answered. The patient agreed with the plan and demonstrated an understanding of the instructions.   The patient was advised to call back or seek an in-person evaluation if the symptoms worsen or if the condition fails to improve as anticipated.  I provided 180 minutes of non-face-to-face time during this encounter.   Lise Auer, LCSW
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
## 2019-08-26 ENCOUNTER — Other Ambulatory Visit (HOSPITAL_COMMUNITY): Payer: 59 | Admitting: Psychiatry

## 2019-08-26 ENCOUNTER — Ambulatory Visit (HOSPITAL_COMMUNITY): Payer: 59

## 2019-08-26 ENCOUNTER — Other Ambulatory Visit (HOSPITAL_COMMUNITY): Payer: 59

## 2019-08-26 ENCOUNTER — Other Ambulatory Visit: Payer: Self-pay

## 2019-08-26 DIAGNOSIS — F323 Major depressive disorder, single episode, severe with psychotic features: Secondary | ICD-10-CM | POA: Diagnosis not present

## 2019-08-26 DIAGNOSIS — F333 Major depressive disorder, recurrent, severe with psychotic symptoms: Secondary | ICD-10-CM

## 2019-08-26 NOTE — Psych (Signed)
Virtual Visit via Video Note  I connected with Hiran E Werling on 08/12/19 at  9:00 AM EST by a video enabled telemedicine application and verified that I am speaking with the correct person using two identifiers.   I discussed the limitations of evaluation and management by telemedicine and the availability of in person appointments. The patient expressed understanding and agreed to proceed.   I discussed the assessment and treatment plan with the patient. The patient was provided an opportunity to ask questions and all were answered. The patient agreed with the plan and demonstrated an understanding of the instructions.   The patient was advised to call back or seek an in-person evaluation if the symptoms worsen or if the condition fails to improve as anticipated.  Pt wasprovided 240 minutes of non-face-to-face time during this encounter.   Lorin Glass, LCSW    [facility] PHP THERAPIST PROGRESS NOTE  ALEX LEAHY [id number]  Session Time: 9:00 - 10:00  Participation Level: Active  Behavioral Response: CasualAlertDepressed  Type of Therapy: Group Therapy  Treatment Goals addressed: Coping  Interventions: CBT, DBT, Supportive and Reframing  Summary: Clinician led check-in regarding current stressors and situation, and review of patient completed daily inventory. Clinician utilized active listening and empathetic response and validated patient emotions. Clinician facilitated processing group on pertinent issues.   Therapist Response:Adeeb E Grudzien is a 28 y.o. male who presents with depression and mood dysregulation symptoms.Patient arrived within time allowed and reports that he is feeling "pretty good." Patient rates hismood at a 8 on a scale of 1-10 with 10 being great. Pt reports his afternoon went well and he went to the gym and spent time outside. Pt reports he was able to check the facts regarding his worries about his neighbor and the worry subsided.  Pt  continues to struggle with mood dependant thinking. Pt able to process. Patient engaged in discussion.       Session Time: 10:00 -11:00  Participation Level: Active  Behavioral Response: CasualAlertDepressed  Type of Therapy: Group Therapy, psychoeducation, psychotherapy  Treatment Goals addressed: Coping  Interventions: CBT, DBT, Solution Focused, Supportive and Reframing  Summary: Cln led review of ACCEPTS skills and group members shared their success and barriers with practicing. Cln and group members discussed ways to increase effectiveness and use of skills to aid in emotion management.   Therapist Response: Pt engaged in discussion and shares he has practiced distraction skills and has most success with "A" and "P." Pt states his main barrier is focus and remembering to use them.       Session Time: 11:00- 12:00  Participation Level: Active  Behavioral Response: CasualAlertDepressed  Type of Therapy: Group Therapy, Psychoeducation; Psychotherapy  Treatment Goals addressed: Coping  Interventions: CBT; Solution focused; Supportive; Reframing  Summary: Cln introduced topic of healthy conflict. Group members discussed how they handle conflict now and what they find problematic about it. Cln utilized Librarian, academic" and group discussed how to incorporate them.   Therapist Response: Pt engaged in discussion and reports he is conflict-adverse. Pt reports struggles with going "[id number]" in conflict and is able to identify action steps.       Session Time: 12:00 -1:00  Participation Level:Active  Behavioral Response:CasualAlertDepressed  Type of Therapy: Group Therapy, OT  Treatment Goals addressed: Coping  Interventions:Psychosocial skills training, Supportive,   Summary:12:00 - 12:50:Occupational Therapy group 12:50 -1:00 Clinician led check-out. Clinician assessed for immediate needs, medication compliance and  efficacy, and safety concerns   Therapist  Response:12:00 - 12:50:Patient engaged in group. See OT note.  12:50 - 1:00: At check-out, patient rates his mood at a  8 on a scale of 1-10 with 10 being great.Pt states afternoon plans of doing projects around the apartment. Patient demonstrates someprogress as evidenced by applying skills yesterday.Patient denies SI/HI/self-harm at the end of group.     Suicidal/Homicidal: Nowithout intent/plan  Plan: Pt will continue in PHP while working to decrease depression symptoms, increase emotional regulation, and increase ability to manage symptoms in a healthy manner.   Diagnosis: Severe episode of recurrent major depressive disorder, with psychotic features (HCC) [F33.3]    1. Severe episode of recurrent major depressive disorder, with psychotic features (HCC)       Donia Guiles, LCSW 08/26/2019

## 2019-08-26 NOTE — Psych (Signed)
Virtual Visit via Video Note  I connected with Jimmy Barr on 08/11/19 at  9:00 AM EST by a video enabled telemedicine application and verified that I am speaking with the correct person using two identifiers.   I discussed the limitations of evaluation and management by telemedicine and the availability of in person appointments. The patient expressed understanding and agreed to proceed.   I discussed the assessment and treatment plan with the patient. The patient was provided an opportunity to ask questions and all were answered. The patient agreed with the plan and demonstrated an understanding of the instructions.   The patient was advised to call back or seek an in-person evaluation if the symptoms worsen or if the condition fails to improve as anticipated.  Jimmy Barr wasprovided 240 minutes of non-face-to-face time during this encounter.   Lorin Glass, LCSW    [facility] PHP THERAPIST PROGRESS NOTE  Jimmy Barr [id number]  Session Time: 9:00 - 10:00  Participation Level: Active  Behavioral Response: CasualAlertDepressed  Type of Therapy: Group Therapy  Treatment Goals addressed: Coping  Interventions: CBT, DBT, Supportive and Reframing  Summary: Clinician led check-in regarding current stressors and situation, and review of patient completed daily inventory. Clinician utilized active listening and empathetic response and validated patient emotions. Clinician facilitated processing group on pertinent issues.   Therapist Response:Jimmy Barr is a 28 y.o. male who presents with depression and mood dysregulation symptoms.Patient arrived within time allowed and reports that he is feeling "good." Patient rates hismood at a 5on a scale of 1-10 with 10 being great. Jimmy Barr reports things continue well at his apartment and he spent time playing video games with his cousin yesterday. Jimmy Barr shares struggling with some "paranoia" about his neighbor this morning because he can  see him in the parking lot outside of Jimmy Barr's window. Cln questions if what Jimmy Barr describes as paranoia may be anxious feelings. Jimmy Barr able to process. Patient engaged in discussion.       Session Time: 10:00 -11:00  Participation Level: Active  Behavioral Response: CasualAlertDepressed  Type of Therapy: Group Therapy, psychoeducation, psychotherapy  Treatment Goals addressed: Coping  Interventions: CBT, DBT, Solution Focused, Supportive and Reframing  Summary: Cln led discussion on agression and the way it affects the body. Group members brainstormed healthy ways to manage agression such as punching pillows, exercise, and screaming with the car windows down.   Therapist Response: Jimmy Barr engaged in discussion and shares attempting to bury anger or agression. Jimmy Barr successfully idnetifies ways to manage agression in a healthy manner such as exercising.      Session Time: 11:00- 12:00  Participation Level: Active  Behavioral Response: CasualAlertDepressed  Type of Therapy: Group Therapy, Psychoeducation; Psychotherapy  Treatment Goals addressed: Coping  Interventions: CBT; Solution focused; Supportive; Reframing  Summary: Cln continued topic of DBT ACCEPTS skills and led review of previously discussed components. Cln introduced the S skill and group discussed how to utilize the skill in their own life.   Therapist Response: Jimmy Barr engaged in discussion and reports understanding of skill. Jimmy Barr identifies getting his feet wet as a way he can apply the skill.        Session Time: 12:00 -1:00  Participation Level:Active  Behavioral Response:CasualAlertDepressed  Type of Therapy: Group Therapy, OT  Treatment Goals addressed: Coping  Interventions:Psychosocial skills training, Supportive,   Summary:12:00 - 12:50:Occupational Therapy group 12:50 -1:00 Clinician led check-out. Clinician assessed for immediate needs, medication compliance and efficacy, and  safety concerns   Therapist Response:12:00 -  12:50:Patient engaged in group. See OT note.   12:50 - 1:00: At check-out, patient rates his mood at a 6 on a scale of 1-10 with 10 being great.Jimmy Barr states afternoon plans of going to the gym. Patient demonstrates someprogress as evidenced by increased emotional regulation.Patient denies SI/HI/self-harm at the end of group.     Suicidal/Homicidal: Nowithout intent/plan  Plan: Jimmy Barr will continue in PHP while working to decrease depression symptoms, increase emotional regulation, and increase ability to manage symptoms in a healthy manner.   Diagnosis: Severe episode of recurrent major depressive disorder, with psychotic features (HCC) [F33.3]    1. Severe episode of recurrent major depressive disorder, with psychotic features (HCC)       Donia Guiles, LCSW 08/26/2019

## 2019-08-27 ENCOUNTER — Encounter (HOSPITAL_COMMUNITY): Payer: Self-pay
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
## 2019-08-28 NOTE — Psych (Signed)
Virtual Visit via Video Note  I connected with Jimmy Barr on 08/16/19 at  9:00 AM EST by a video enabled telemedicine application and verified that I am speaking with the correct person using two identifiers.   I discussed the limitations of evaluation and management by telemedicine and the availability of in person appointments. The patient expressed understanding and agreed to proceed.   I discussed the assessment and treatment plan with the patient. The patient was provided an opportunity to ask questions and all were answered. The patient agreed with the plan and demonstrated an understanding of the instructions.   The patient was advised to call back or seek an in-person evaluation if the symptoms worsen or if the condition fails to improve as anticipated.  Pt wasprovided 240 minutes of non-face-to-face time during this encounter.   Donia Guiles, LCSW    [facility] PHP THERAPIST PROGRESS NOTE  Jimmy Barr [id number]  Session Time: 9:00 - 10:00  Participation Level: Active  Behavioral Response: CasualAlertDepressed  Type of Therapy: Group Therapy  Treatment Goals addressed: Coping  Interventions: CBT, DBT, Supportive and Reframing  Summary: Clinician led check-in regarding current stressors and situation, and review of patient completed daily inventory. Clinician utilized active listening and empathetic response and validated patient emotions. Clinician facilitated processing group on pertinent issues.   Therapist Response:Jimmy Barr is a 28 y.o. male who presents with depression and mood dysregulation symptoms.Patient arrived within time allowed and reports that he is feeling "pretty good." Patient rates hismood at a 7 on a scale of 1-10 with 10 being great. Pt reports his afternoon went well and he went to the gym, cooked, and changed his oil. Pt reports improved sleep. Pt able to process. Patient engaged in discussion.       Session Time:  10:00-11:00  Participation Level:Active  Behavioral Response:CasualAlertDepressed  Type of Therapy: Group Therapy, psychoeducation, psychotherapy  Treatment Goals addressed: Coping  Interventions:CBT, DBT, Solution Focused, Supportive and Reframing  Summary:Cln led discussion on grief. Group members shared losses that have and are affecting them. Group members shared struggles they experience and offered support to one another.   Therapist Response: Pt enagaged in discussion and provides supportive feedback to group.        Session Time: 11:00- 12:00  Participation Level:Active  Behavioral Response:CasualAlertDepressed  Type of Therapy: Group Therapy, psychoeducation, psychotherapy  Treatment Goals addressed: Coping  Interventions:CBT, DBT, Solution Focused, Supportive and Reframing  Summary:Cln continued topic of cognitive distortions. Cln utilized handout "unhealthy thought patterns" and group members connected distortions to real life examples.   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         Session Time: 12:00 -1:00  Participation Level:Active  Behavioral Response:CasualAlertDepressed  Type of Therapy: Group Therapy, Psychoeducation; Psychotherapy  Treatment Goals addressed: Coping  Interventions:CBT; Solution focused; Supportive; Reframing  Summary:12:00 - 12:50Cln continued topic of cognitive distortions and led practice activity. Group members worked to [employer]" the disorted thinking in statements and scenarios and apply previous components of topic discussed in identifying what is problematic about the "caught" distortions.  12:50 -1:00 Clinician led check-out. Clinician assessed for immediate needs, medication compliance and efficacy, and safety concerns  Therapist Response:12:00 - 12:50Pt  participated in activity and demonstrates understanding of topic and processes to apply to his life.  12:50 - 1:00: At check-out, patient rates his mood at a  7 on a scale of 1-10 with 10  being great.Pt states afternoon plans of taking a nap. Patient demonstrates someprogress as evidenced by overall brightened affect over the past week.Patient denies SI/HI/self-harm at the end of group.     Suicidal/Homicidal: Nowithout intent/plan  Plan: Pt will continue in PHP while working to decrease depression symptoms, increase emotional regulation, and increase ability to manage symptoms in a healthy manner.   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 Severe episode of recurrent major depressive disorder, with psychotic features ([place])   2. Difficulty coping       Lorin Glass, LCSW 08/28/2019

## 2019-08-28 NOTE — Psych (Signed)
Virtual Visit via Video Note  I connected with Jimmy Barr on 08/15/19 at  9:00 AM EST by a video enabled telemedicine application and verified that I am speaking with the correct person using two identifiers.   I discussed the limitations of evaluation and management by telemedicine and the availability of in person appointments. The patient expressed understanding and agreed to proceed.   I discussed the assessment and treatment plan with the patient. The patient was provided an opportunity to ask questions and all were answered. The patient agreed with the plan and demonstrated an understanding of the instructions.   The patient was advised to call back or seek an in-person evaluation if the symptoms worsen or if the condition fails to improve as anticipated.  Pt wasprovided 240 minutes of non-face-to-face time during this encounter.   Jimmy Guiles, LCSW    [facility] PHP THERAPIST PROGRESS NOTE  KAZUMI LACHNEY [id number]  Session Time: 9:00 - 10:00  Participation Level: Active  Behavioral Response: CasualAlertDepressed  Type of Therapy: Group Therapy  Treatment Goals addressed: Coping  Interventions: CBT, DBT, Supportive and Reframing  Summary: Clinician led check-in regarding current stressors and situation, and review of patient completed daily inventory. Clinician utilized active listening and empathetic response and validated patient emotions. Clinician facilitated processing group on pertinent issues.   Therapist Response:Jimmy Barr is a 28 y.o. male who presents with depression and mood dysregulation symptoms.Patient arrived within time allowed and reports that he is feeling "fine." Patient rates hismood at a 7 on a scale of 1-10 with 10 being great. Pt reports he had a good weekend and did his housing tasks, helped a friend move, and rested. Pt states he has been taking his newly prescribed medication diligently. Pt reports struggle with focus. Pt  able to process. Patient engaged in discussion.       Session Time: 10:00 -11:00  Participation Level: Active  Behavioral Response: CasualAlertDepressed  Type of Therapy: Group Therapy, psychoeducation, psychotherapy  Treatment Goals addressed: Coping  Interventions: CBT, DBT, Solution Focused, Supportive and Reframing  Summary: Cln led discussion on how setting limits in relationships can create a healthier dynamic. Group members shared ways in which they feel they were taken advantage of in previous relationships and what they have learned from those experiences.   Therapist Response: Pt enagaged in discussion and shares he can see ways in which limits would have helped his previous relationships, specifically listening to his "gut" more often.         Session Time: 11:00- 12:00  Participation Level: Active  Behavioral Response: CasualAlertDepressed  Type of Therapy: Group Therapy, psychoeducation, psychotherapy  Treatment Goals addressed: Coping  Interventions: CBT, DBT, Solution Focused, Supportive and Reframing  Summary: Cln introduced topic of cognitive distortions. Cln presented Catch-Challenge-Change behavior modification model and how it can be applied to cognitive distortions and breaking the cycle of negative and irrational thinking. Group discussed ways in which negative thinking patterns have affected them.   Therapist Response: Pt reports understanding of topic discussed and engaged in discussion.          Session Time: 12:00 -1:00  Participation Level:Active  Behavioral Response:CasualAlertDepressed  Type of Therapy: Group Therapy, Psychoeducation; Psychotherapy  Treatment Goals addressed: Coping  Interventions:CBT; Solution focused; Supportive; Reframing  Summary:12:00 - 12:50 Cln continued topic of cognitive distortions. Cln utilizied handout "Cognitive Distortions" and group discussed examples of  distorted thinking from their own lives. 12:50 -1:00 Clinician led check-out. Clinician assessed for immediate needs, medication  compliance and efficacy, and safety concerns  Therapist Response: 12:00 - 12:50 Pt engaged in discussion and is able to apply distortions to his life. Pt identifies mind reading, fortune telling, and personlization as most problematic for him.  12:50 - 1:00: At check-out, patient rates his mood at a  7 on a scale of 1-10 with 10 being great.Pt states afternoon plans of going to the gym, cooking dinner, and running errrands. Patient demonstrates someprogress as evidenced by increased medication compliance.Patient denies SI/HI/self-harm at the end of group.     Suicidal/Homicidal: Nowithout intent/plan  Plan: Pt will continue in PHP while working to decrease depression symptoms, increase emotional regulation, and increase ability to manage symptoms in a healthy manner.   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 Severe episode of recurrent major depressive disorder, with psychotic features ([place])       Jimmy Glass, LCSW 08/28/2019

## 2019-08-28 NOTE — Psych (Signed)
Virtual Visit via Video Note  I connected with Jimmy Barr on 08/17/19 at  9:00 AM EST by a video enabled telemedicine application and verified that I am speaking with the correct person using two identifiers.   I discussed the limitations of evaluation and management by telemedicine and the availability of in person appointments. The patient expressed understanding and agreed to proceed.   I discussed the assessment and treatment plan with the patient. The patient was provided an opportunity to ask questions and all were answered. The patient agreed with the plan and demonstrated an understanding of the instructions.   The patient was advised to call back or seek an in-person evaluation if the symptoms worsen or if the condition fails to improve as anticipated.  Pt wasprovided 240 minutes of non-face-to-face time during this encounter.   Jimmy Glass, LCSW    [facility] PHP THERAPIST PROGRESS NOTE  Jimmy Barr [id number]  Session Time: 9:00 - 10:00  Participation Level: Active  Behavioral Response: CasualAlertDepressed  Type of Therapy: Group Therapy  Treatment Goals addressed: Coping  Interventions: CBT, DBT, Supportive and Reframing  Summary: Clinician led check-in regarding current stressors and situation, and review of patient completed daily inventory. Clinician utilized active listening and empathetic response and validated patient emotions. Clinician facilitated processing group on pertinent issues.   Therapist Response:Jimmy Barr is a 28 y.o. male who presents with depression and mood dysregulation symptoms.Patient arrived within time allowed and reports that he is feeling "okay." Patient rates hismood at a 6 on a scale of 1-10 with 10 being great. Pt reports he slept "a lot" yesterday with a 4 hour nap and 9 hours at night. Pt states he had a good start to his morning and is looking forward to the holiday weekend. Pt reports struggles with  boundaries on empathy. Pt able to process. Patient engaged in discussion.        Session Time: 10:00-11:00  Participation Level:Active  Behavioral Response:CasualAlertDepressed  Type of Therapy: Group Therapy, psychoeducation, psychotherapy  Treatment Goals addressed: Coping  Interventions:CBT, DBT, Solution Focused, Supportive and Reframing  Summary:Cln led discussion on regret. Group members shared regrets they have and how they are affecting them currently.   Therapist Response: Pt engaged in discussion and processed regrets he has experienced and reports it is difficult for him not to ruminate on his regrets. Pt provided and recieved support and feedback from other group members.        Session Time: 11:00- 12:00  Participation Level:Active  Behavioral Response:CasualAlertDepressed  Type of Therapy: Group Therapy, psychoeducation, psychotherapy  Treatment Goals addressed: Coping  Interventions:CBT, DBT, Solution Focused, Supportive and Reframing  Summary:Cln continued topic of cognitive distortions and led review. Cln introduced second portion of "catch-challenge-change" model and utilized handout "Socratic Questions" to discuss ways to challenge distorted thinking.    Therapist Response: Pt engaged in discussion and demonstrates understanding of how to challenge thoughts.           Session Time: 12:00 -1:00  Participation Level:Active  Behavioral Response:CasualAlertDepressed  Type of Therapy: Group Therapy, Psychoeducation; Psychotherapy  Treatment Goals addressed: Coping  Interventions:CBT; Solution focused; Supportive; Reframing  Summary:12:00 - 12:50Cln introduced topic of gratitude and how to apply positive psychology tenets to use gratitudes as a way to increase brain plasticity and improve ability to see the positive. Group completed gratitude activity. 12:50 -1:00 Clinician led check-out.  Clinician assessed for immediate needs, medication compliance and efficacy, and safety concerns  Therapist Response:12:00 - 12:50Pt reports  understanding of how to apply gratitude to change perspective. Pt participated  in activity and identifies gratitude for less pressure on his time. 12:50 - 1:00: At check-out, patient rates his mood at a  7 on a scale of 1-10 with 10 being great.Pt states afternoon plans of playing video games and watching tv. Patient demonstrates someprogress as evidenced by increased balance of thought.Patient denies SI/HI/self-harm at the end of group.     Suicidal/Homicidal: Nowithout intent/plan  Plan: Pt will continue in PHP while working to decrease depression symptoms, increase emotional regulation, and increase ability to manage symptoms in a healthy manner.   Diagnosis: Severe episode of recurrent major depressive disorder, with psychotic features ([facility]) [F33.3]    1. Severe episode of recurrent major depressive disorder, with psychotic features ([facility])       Jimmy Guiles, LCSW 08/28/2019

## 2019-08-29 ENCOUNTER — Other Ambulatory Visit (HOSPITAL_COMMUNITY): Payer: 59 | Admitting: Psychiatry

## 2019-08-29 ENCOUNTER — Other Ambulatory Visit: Payer: Self-pay

## 2019-08-29 DIAGNOSIS — F323 Major depressive disorder, single episode, severe with psychotic features: Secondary | ICD-10-CM | POA: Diagnosis not present

## 2019-08-29 DIAGNOSIS — F333 Major depressive disorder, recurrent, severe with psychotic symptoms: Secondary | ICD-10-CM

## 2019-08-29 NOTE — Progress Notes (Signed)
Virtual Visit via Video Note  I connected with Khaleem E Pask on 08/29/19 at  9:00 AM EST by a video enabled telemedicine application and verified that I am speaking with the correct person using two identifiers.  Location: Patient: Jimmy Barr Provider: Lise Auer, LCSW   I discussed the limitations of evaluation and management by telemedicine and the availability of in person appointments. The patient expressed understanding and agreed to proceed.  History of Present Illness: MDD   Observations/Objective: Case Manager checked in with all participants to review discharge dates, insurance authorizations, work-related documents and needs for the treatment team. Counselor processed current mood and functioning and discussed how participants spent their time since last session and if skills were applied. [name] shared about positive experiences with his friends over the weekend which provided encouragement to pursue a new direction in life. He presented as happier and showed excitement for life. He presents with mild anxiety and moderate depression.   Counselor provided the group with psychoeducation and activities on the topic of Emotional Intelligence. Group members took an assessment which revealed their emotional intelligence and ways to improve their scores and interactions with others. [name] connected with the emotion of frustration due to the tensions helping to motivate change and progress towards his goals. He scored a 55 and endorsed a need for improvement in social skills and need for more motivators.[name] shared about the positive impacts of deleting his social media accounts on his mental health.   Counselor prompted group members to share their plans for the afternoon, emphasizing self-care and productivity activities. [name] plans to attend a eye doctor appointment to replace lost glasses for self-care.   Assessment and Plan: Counselor recommends that patient remains in IOP treatment to  better manage mental health symptoms and continue to address treatment plan goals. Counselor recommends adherence to crisis/safety plan, taking medications as prescribed and following up with medical professionals if any issues arise.   Follow Up Instructions: Counselor will send Webex link for next session.    I discussed the assessment and treatment plan with the patient. The patient was provided an opportunity to ask questions and all were answered. The patient agreed with the plan and demonstrated an understanding of the instructions.   The patient was advised to call back or seek an in-person evaluation if the symptoms worsen or if the condition fails to improve as anticipated.  I provided 180 minutes of non-face-to-face time during this encounter.   Lise Auer, LCSW

## 2019-08-30 ENCOUNTER — Other Ambulatory Visit (HOSPITAL_COMMUNITY): Payer: 59 | Admitting: Psychiatry

## 2019-08-30 ENCOUNTER — Other Ambulatory Visit: Payer: Self-pay

## 2019-08-30 ENCOUNTER — Encounter (HOSPITAL_COMMUNITY): Payer: Self-pay

## 2019-08-30 DIAGNOSIS — F323 Major depressive disorder, single episode, severe with psychotic features: Secondary | ICD-10-CM | POA: Diagnosis not present

## 2019-08-30 DIAGNOSIS — F333 Major depressive disorder, recurrent, severe with psychotic symptoms: Secondary | ICD-10-CM

## 2019-08-30 NOTE — Progress Notes (Signed)
Virtual Visit via Video Note  I connected with Jimmy Barr on 08/30/19 at  9:00 AM EST by a video enabled telemedicine application and verified that I am speaking with the correct person using two identifiers.  Location: Patient: Jimmy Barr Provider: Lise Auer, LCSW   I discussed the limitations of evaluation and management by telemedicine and the availability of in person appointments. The patient expressed understanding and agreed to proceed.  History of Present Illness: MDD   Observations/Objective: Case Manager checked in with all participants to review discharge dates, insurance authorizations, work-related documents and needs for the treatment team. Counselor processed current mood and functioning and discussed how participants spent their time since last session and if skills were applied. Jimmy Barr shared that he was working on creating a Quar[employer] of a dream he experienced last night as a reminder of what he is working on in group and within his personal goals. He shared that followed through on his vision appointment. He continues to feel energy around new career endeavors. Jimmy Barr presents with moderate anxiety and moderate depression.   Counselor provided the group with psychoeducation and activities on the topic of Right Brain vs Left Brain. Group members tool an assessment to identify which side of the brain they function out of more naturally. Jimmy Barr scored 75% right brained and 25% left brained. Counselor provided coping skills and strategies for engaging and finding a balance in using logic and emotion to work together in more helpful ways. Jimmy Barr wants to practice self-compassion and balancing out emotions with logic in his reactions to others.   Counselor prompted group members to share their plans for the afternoon, emphasizing self-care and productivity activities. Jimmy Barr plans to continue studying for an upcoming exam.   Assessment and Plan: Counselor recommends that patient  remains in IOP treatment to better manage mental health symptoms and continue to address treatment plan goals. Counselor recommends adherence to crisis/safety plan, taking medications as prescribed and following up with medical professionals if any issues arise.   Follow Up Instructions: Counselor will send Webex link for next session.    I discussed the assessment and treatment plan with the patient. The patient was provided an opportunity to ask questions and all were answered. The patient agreed with the plan and demonstrated an understanding of the instructions.   The patient was advised to call back or seek an in-person evaluation if the symptoms worsen or if the condition fails to improve as anticipated.  I provided 180 minutes of non-face-to-face time during this encounter.   Lise Auer, LCSW

## 2019-08-30 NOTE — Psych (Signed)
Virtual Visit via Video Note  I connected with Jimmy Barr on 08/23/19 at  9:00 AM EST by a video enabled telemedicine application and verified that I am speaking with the correct person using two identifiers.   I discussed the limitations of evaluation and management by telemedicine and the availability of in person appointments. The patient expressed understanding and agreed to proceed.   I discussed the assessment and treatment plan with the patient. The patient was provided an opportunity to ask questions and all were answered. The patient agreed with the plan and demonstrated an understanding of the instructions.   The patient was advised to call back or seek an in-person evaluation if the symptoms worsen or if the condition fails to improve as anticipated.  Pt wasprovided 240 minutes of non-face-to-face time during this encounter.   Jimmy Glass, LCSW    [facility] PHP THERAPIST PROGRESS NOTE  Jimmy Barr [id number]  Session Time: 9:00 - 10:00  Participation Level: Minimal  Behavioral Response: CasualAlertDepressed  Type of Therapy: Group Therapy  Treatment Goals addressed: Coping  Interventions: CBT, DBT, Supportive and Reframing  Summary: Clinician led check-in regarding current stressors and situation, and review of patient completed daily inventory. Clinician utilized active listening and empathetic response and validated patient emotions. Clinician facilitated processing group on pertinent issues.   Therapist Response:Jimmy Barr is a 28 y.o. male who presents with depression and mood dysregulation symptoms.Patient arrived within time allowed and reports that he is feeling "not bad." Patient rates hismood at a 8 on a scale of 1-10 with 10 being great. Pt reports yesterday was "fine" and that he hung out with friends. Pt reports drinking to relax last night and having nightmares. Pt states he is struggling with feelings of betrayal however does not  want to talk about them. Pt continues to present as guarded and short. Pt states he has "an important call" he is waiting for and might have to step away to answer it. After this statement, pt is largely absent and unengaged for the remainder of the treatment day.        Session Time: 10:00 -11:00  Participation Level: None  Behavioral Response: CasualAlertDepressed  Type of Therapy: Group Therapy, psychoeducation, psychotherapy  Treatment Goals addressed: Coping  Interventions: CBT, DBT, Solution Focused, Supportive and Reframing  Summary: Cln led discussion on willfulness and acceptance. Group members discussed barriers they are having in engaging in healthy or helpful habits. Cln provided education on DBT tenet of radical acceptance and offered acceptance as a way to decrease consequences of willfulness.   Therapist Response: Pt responds "yes" when cln asked if he understood concept of acceptance. Pt did not engage any further in discussion. Pt had camera off during session and did not respond when cln reminded him it was expected to be on.       Session Time: 11:00- 12:00  Participation Level: None  Behavioral Response: CasualAlertDepressed  Type of Therapy: Group Therapy, Psychoeducation; Psychotherapy  Treatment Goals addressed: Coping  Interventions: CBT; Solution focused; Supportive; Reframing  Summary: Cln continued topic of boundaries. Cln utilized handout "Setting Boundaries" to discuss basic process of setting a boundary. Group utilized a group member example to walk through common pitfalls which occur when trying to set a boundary.   Therapist Response: Pt logged in and out of group and did not respond to any of cln's communications.        Session Time: 12:00 -1:00  Participation Level:None  Behavioral Response:CasualAlertDepressed  Type of Therapy: Group Therapy, OT  Treatment Goals addressed:  Coping  Interventions:Psychosocial skills training, Supportive,   Summary:12:00 - 12:50:Occupational Therapy group 12:50 -1:00 Clinician led check-out. Clinician assessed for immediate needs, medication compliance and efficacy, and safety concerns   Therapist Response:12:00 - 12:50:See OT note 12:50 - 1:00: Pt was not present for check-out. Pt denied SI/HI/self-harm at the beginning of the group.     Suicidal/Homicidal: Nowithout intent/plan  Plan: Pt will discharge from PHP as scheduled. Pt met treatment goals of decreased depression symptoms, increased emotional regulation, and increase in managing symptoms. Pt is scheduled to step-down to IOP within this agency tomorrow on 12/2 and reported continued alignment during morning session of group. Pt denies SI/HI/self-harm thoughts.  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 Severe episode of recurrent major depressive disorder, with psychotic features ([place])   2. Difficulty coping       Jimmy Glass, LCSW 08/30/2019
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
## 2019-08-31 ENCOUNTER — Telehealth (HOSPITAL_COMMUNITY): Payer: Self-pay | Admitting: Psychiatry

## 2019-08-31 ENCOUNTER — Other Ambulatory Visit (HOSPITAL_COMMUNITY): Payer: 59

## 2019-08-31 ENCOUNTER — Other Ambulatory Visit: Payer: Self-pay

## 2019-09-01 ENCOUNTER — Other Ambulatory Visit (HOSPITAL_COMMUNITY): Payer: 59 | Admitting: Psychiatry

## 2019-09-01 ENCOUNTER — Other Ambulatory Visit: Payer: Self-pay

## 2019-09-01 ENCOUNTER — Encounter (HOSPITAL_COMMUNITY): Payer: Self-pay

## 2019-09-01 DIAGNOSIS — F333 Major depressive disorder, recurrent, severe with psychotic symptoms: Secondary | ICD-10-CM

## 2019-09-01 DIAGNOSIS — F323 Major depressive disorder, single episode, severe with psychotic features: Secondary | ICD-10-CM | POA: Diagnosis not present

## 2019-09-01 NOTE — Progress Notes (Signed)
Virtual Visit via Video Note  I connected with [name] on 09/01/19 at  9:00 AM EST by a video enabled telemedicine application and verified that I am speaking with the correct person using two identifiers.  Location: Patient: Jimmy Barr Provider: Lise Auer, LCSW   I discussed the limitations of evaluation and management by telemedicine and the availability of in person appointments. The patient expressed understanding and agreed to proceed.  History of Present Illness: MDD  Observations/Objective: Case Manager checked in with all participants to review discharge dates, insurance authorizations, work-related documents and needs for the treatment team. Case manager introduced guest speaker, Jeanella Craze, [place], to facilitate a discussion around Grief and Loss topics. Patient participated in discussion and shared insights about their own needs regarding this topic. Counselor allowed time for reflection and journaling on the topic of grief/loss and promoted continuing this work in individual therapy.   Counselor facilitated a brief check in with group members to gage mood and current functioning as well as their takeaways from the presentation. [name] reported having an increased motivation and energy around accomplishing his goals. He shared about his action steps and change in mindset, which are helping him to notice progress in his personal and professional life. Group members celebrated his success. [name] presents with mild anxiety and mild depression.   Counselor engaged the group in a [employer], to allow group members to share about mental health resources they are aware of with each other. Counselor shared an exhaustive list of local, state, national and international resources, as well as books, websites, apps and other resources. Group members shared which ones they are most interested in engaging in the near future. [name] would like to look into joining an anger  management class at [facility].   Assessment and Plan: Counselor recommends that patient remains in IOP treatment to better manage mental health symptoms and continue to address treatment plan goals. Counselor recommends adherence to crisis/safety plan, taking medications as prescribed and following up with medical professionals if any issues arise.   Follow Up Instructions: Counselor will send Webex link for next session.    I discussed the assessment and treatment plan with the patient. The patient was provided an opportunity to ask questions and all were answered. The patient agreed with the plan and demonstrated an understanding of the instructions.   The patient was advised to call back or seek an in-person evaluation if the symptoms worsen or if the condition fails to improve as anticipated.  I provided 180 minutes of non-face-to-face time during this encounter.   Lise Auer, LCSW

## 2019-09-02 ENCOUNTER — Other Ambulatory Visit: Payer: Self-pay

## 2019-09-02 ENCOUNTER — Other Ambulatory Visit (HOSPITAL_COMMUNITY): Payer: 59 | Admitting: Psychiatry

## 2019-09-02 ENCOUNTER — Encounter (HOSPITAL_COMMUNITY): Payer: Self-pay

## 2019-09-02 DIAGNOSIS — F333 Major depressive disorder, recurrent, severe with psychotic symptoms: Secondary | ICD-10-CM

## 2019-09-02 DIAGNOSIS — F323 Major depressive disorder, single episode, severe with psychotic features: Secondary | ICD-10-CM | POA: Diagnosis not present

## 2019-09-02 NOTE — Progress Notes (Signed)
Virtual Visit via Video Note  I connected with Jimmy Barr on 09/02/19 at  9:00 AM EST by a video enabled telemedicine application and verified that I am speaking with the correct person using two identifiers.  Location: Patient: Jimmy Barr Provider: Lise Auer, LCSW   History of Present Illness: MDD   Observations/Objective: Case Manager checked in with all participants to review discharge dates, insurance authorizations, work-related documents and needs for the treatment team. Counselor processed current mood and functioning and discussed how participants spent their time since last session and if skills were applied. Jimmy Barr noted that he continues to be self-motivated to work towards career goals and noted a variety of steps he is taking to achieve his goals. He described the process and is feeling good and confident in the direction he has chosen. Jimmy Barr presents with moderate depression and mild anxiety.   Counselor facilitated a discussion on boundary setting and communication within relationships. Group members shared about current relationship issues and strategies to set more clear and assertive boundaries. Jimmy Barr shared about progress in a recently ended relationship. He has been able to stick with his boundaries, use distraction methods and avoid manipulation efforts. He reported feeling safer and more in control of his emotions taking these actions.   Counselor introduced guest speakers Cristie Hem and Truman Hayward from [place] to share about programs and services offered for group members to engage in to continue management of mental health symptoms. Group members identified which programs they would most like to engage in. Jimmy Barr identified that he could benefit from anger management groups and additional support groups/assessments.   Assessment and Plan: Counselor recommends that patient remains in IOP treatment to better manage mental health symptoms and continue to  address treatment plan goals. Counselor recommends adherence to crisis/safety plan, taking medications as prescribed and following up with medical professionals if any issues arise.   Follow Up Instructions: Counselor will send Webex link for next session.  The patient was advised to call back or seek an in-person evaluation if the symptoms worsen or if the condition fails to improve as anticipated.  I provided 180 minutes of non-face-to-face time during this encounter.   Lise Auer, LCSW

## 2019-09-05 ENCOUNTER — Telehealth (HOSPITAL_COMMUNITY): Payer: Self-pay | Admitting: Psychiatry

## 2019-09-05 ENCOUNTER — Other Ambulatory Visit: Payer: Self-pay

## 2019-09-05 ENCOUNTER — Other Ambulatory Visit (HOSPITAL_COMMUNITY): Payer: 59 | Admitting: Psychiatry

## 2019-09-06 ENCOUNTER — Other Ambulatory Visit (HOSPITAL_COMMUNITY): Payer: 59 | Admitting: Psychiatry

## 2019-09-06 ENCOUNTER — Other Ambulatory Visit: Payer: Self-pay

## 2019-09-06 ENCOUNTER — Encounter (HOSPITAL_COMMUNITY): Payer: Self-pay

## 2019-09-06 DIAGNOSIS — F323 Major depressive disorder, single episode, severe with psychotic features: Secondary | ICD-10-CM | POA: Diagnosis not present

## 2019-09-06 DIAGNOSIS — F333 Major depressive disorder, recurrent, severe with psychotic symptoms: Secondary | ICD-10-CM
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
## 2019-09-06 NOTE — Progress Notes (Signed)
Virtual Visit via Video Note  I connected with Jimmy Barr on 09/06/19 at  9:00 AM EST by a video enabled telemedicine application and verified that I am speaking with the correct person using two identifiers.  Location: Patient: Jimmy Barr Provider: Lise Auer, LCSW   History of Present Illness: MDD   Observations/Objective: Case Manager checked in with all participants to review discharge dates, insurance authorizations, work-related documents and needs for the treatment team. Counselor processed current mood and functioning and discussed how participants spent their time since last session and if skills were applied. 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 [name] discusses how he was impacted emotionally and skills he used to deal with the adversities. He plans to use delay to study more for the exam. [name] presents with mild depression and moderate anxiety.    Counselor provided psychoeducation and coping strategies using videos and handouts related to procrastination and time management, as many group members have expressed strategies in those areas to be more productive and to decrease anxiety. Counselor covered creation of SMART goals in implementing strategies. Group members gave feedback on challenges in this area and ways they can improve skills. [name] stated that he works the best under pressure and when he feels forced to make a decision. He likes having a plan B and being consistent in his efforts.   Counselor prompted group members to share their plans for the afternoon, including a self-care practice and/or a productivity activity to alleviate stress and anxiety. [name] plans to continue studying and to spend quality time with his friends.   Assessment and Plan: Counselor recommends that patient remains in IOP treatment to better manage mental health symptoms and continue to address treatment plan goals. Counselor recommends  adherence to crisis/safety plan, taking medications as prescribed and following up with medical professionals if any issues arise.   Follow Up Instructions: Counselor will send Webex link for next session.  The patient was advised to call back or seek an in-person evaluation if the symptoms worsen or if the condition fails to improve as anticipated.  I provided 180 minutes of non-face-to-face time during this encounter.   Lise Auer, LCSW

## 2019-09-07 ENCOUNTER — Encounter (HOSPITAL_COMMUNITY): Payer: Self-pay

## 2019-09-07 ENCOUNTER — Other Ambulatory Visit: Payer: Self-pay

## 2019-09-07 ENCOUNTER — Other Ambulatory Visit (HOSPITAL_COMMUNITY): Payer: 59 | Admitting: Psychiatry

## 2019-09-07 DIAGNOSIS — F323 Major depressive disorder, single episode, severe with psychotic features: Secondary | ICD-10-CM | POA: Diagnosis not present

## 2019-09-07 DIAGNOSIS — F333 Major depressive disorder, recurrent, severe with psychotic symptoms: Secondary | ICD-10-CM

## 2019-09-07 NOTE — Progress Notes (Signed)
Virtual Visit via Video Note  I connected with Jimmy Barr on 09/07/19 at  9:00 AM EST by a video enabled telemedicine application and verified that I am speaking with the correct person using two identifiers.  Location: Patient: Jimmy Barr Provider: Lise Auer, LCSW   I discussed the limitations of evaluation and management by telemedicine and the availability of in person appointments. The patient expressed understanding and agreed to proceed.  History of Present Illness: MDD  Observations/Objective: Case Manager checked in with all participants to review discharge dates, insurance authorizations, work-related documents and needs for the treatment team. Case manager introduced guest speaker, Jeanella Craze, [place], to facilitate a discussion around Grief and Loss topics. Patient participated in discussion and shared insights about their own needs regarding this topic. Counselor allowed time for reflection and journaling on the topic of grief/loss and promoted continuing this work in individual therapy.   Counselor facilitated a brief check in with group members to gage mood and current functioning as well as their takeaways from the presentation. [name] shared that he enjoyed time with friends yesterday and focus on studying for his test. [name] plans to take his test today and expressed excitement and confidence. His affect matched, smiling, using positive affirmations and energetic. [name] presented with mild depression and mild anxiety.   Counselor took time to acknowledge a graduating group member, highlighting observations of progress in treatment, strengths and words of encouragement as he transitions to lower level of care. Graduating member shared takeaways from treatment and plans for management of mental health. Group members shared reflections and well wishes.   Counselor introduced the group to Frederich Balding, Doctor, general practice at [facility], who shared a presentation on [employer]. Group members engaged in the discussion and activities, giving feedback about their personal needs, behaviors and goals.   Assessment and Plan: Counselor recommends that patient remains in IOP treatment to better manage mental health symptoms and continue to address treatment plan goals. Counselor recommends adherence to crisis/safety plan, taking medications as prescribed and following up with medical professionals if any issues arise.   Follow Up Instructions: Counselor will send Webex link for next session.    I discussed the assessment and treatment plan with the patient. The patient was provided an opportunity to ask questions and all were answered. The patient agreed with the plan and demonstrated an understanding of the instructions.   The patient was advised to call back or seek an in-person evaluation if the symptoms worsen or if the condition fails to improve as anticipated.  I provided 180 minutes of non-face-to-face time during this encounter.   Lise Auer, LCSW

## 2019-09-08 ENCOUNTER — Other Ambulatory Visit: Payer: Self-pay

## 2019-09-08 ENCOUNTER — Other Ambulatory Visit (HOSPITAL_COMMUNITY): Payer: 59 | Admitting: Psychiatry

## 2019-09-08 DIAGNOSIS — F0630 Mood disorder due to known physiological condition, unspecified: Secondary | ICD-10-CM

## 2019-09-08 DIAGNOSIS — F333 Major depressive disorder, recurrent, severe with psychotic symptoms: Secondary | ICD-10-CM

## 2019-09-08 NOTE — Patient Instructions (Signed)
D:  Patient completed MH-IOP today.  A:  Discharge today.  Follow up with Shade Flood on 09-12-19 @ 11 a.m and Dr. Montel Culver on 09-13-19 @ 1:30 pm.  Encouraged support groups.  R:  Patient receptive.

## 2019-09-09 ENCOUNTER — Ambulatory Visit (HOSPITAL_COMMUNITY): Payer: 59

## 2019-09-12 ENCOUNTER — Telehealth (HOSPITAL_COMMUNITY): Payer: Self-pay | Admitting: Licensed Clinical Social Worker

## 2019-09-12 ENCOUNTER — Ambulatory Visit (HOSPITAL_COMMUNITY): Payer: 59 | Admitting: Licensed Clinical Social Worker

## 2019-09-12 ENCOUNTER — Other Ambulatory Visit: Payer: Self-pay

## 2019-09-12 NOTE — Telephone Encounter (Signed)
Clinician outreached Vasco Dalbert Batman today at 11:00am regarding virtual therapy appointment.  Lorene did not answer this phone call, so a voicemail was left reminding him of this appointment and providing callback number ([phone]).  Rani did not present to [facility] meeting or return phone call after 20 minutes had passed. [place] desk staff was notified of no show event.     [address], [place], [place] 09/12/19

## 2019-09-13 ENCOUNTER — Ambulatory Visit (HOSPITAL_COMMUNITY): Payer: 59 | Admitting: Psychiatry

## 2019-09-13 ENCOUNTER — Other Ambulatory Visit: Payer: Self-pay

## 2020-08-06 IMAGING — DX DG CHEST 2V
2 series · 2 of 2 positions shown · non-contrast
Comparison: 01/14/2007

CLINICAL DATA: Fever cough.

EXAM:
CHEST - 2 VIEW

[w chest pa]
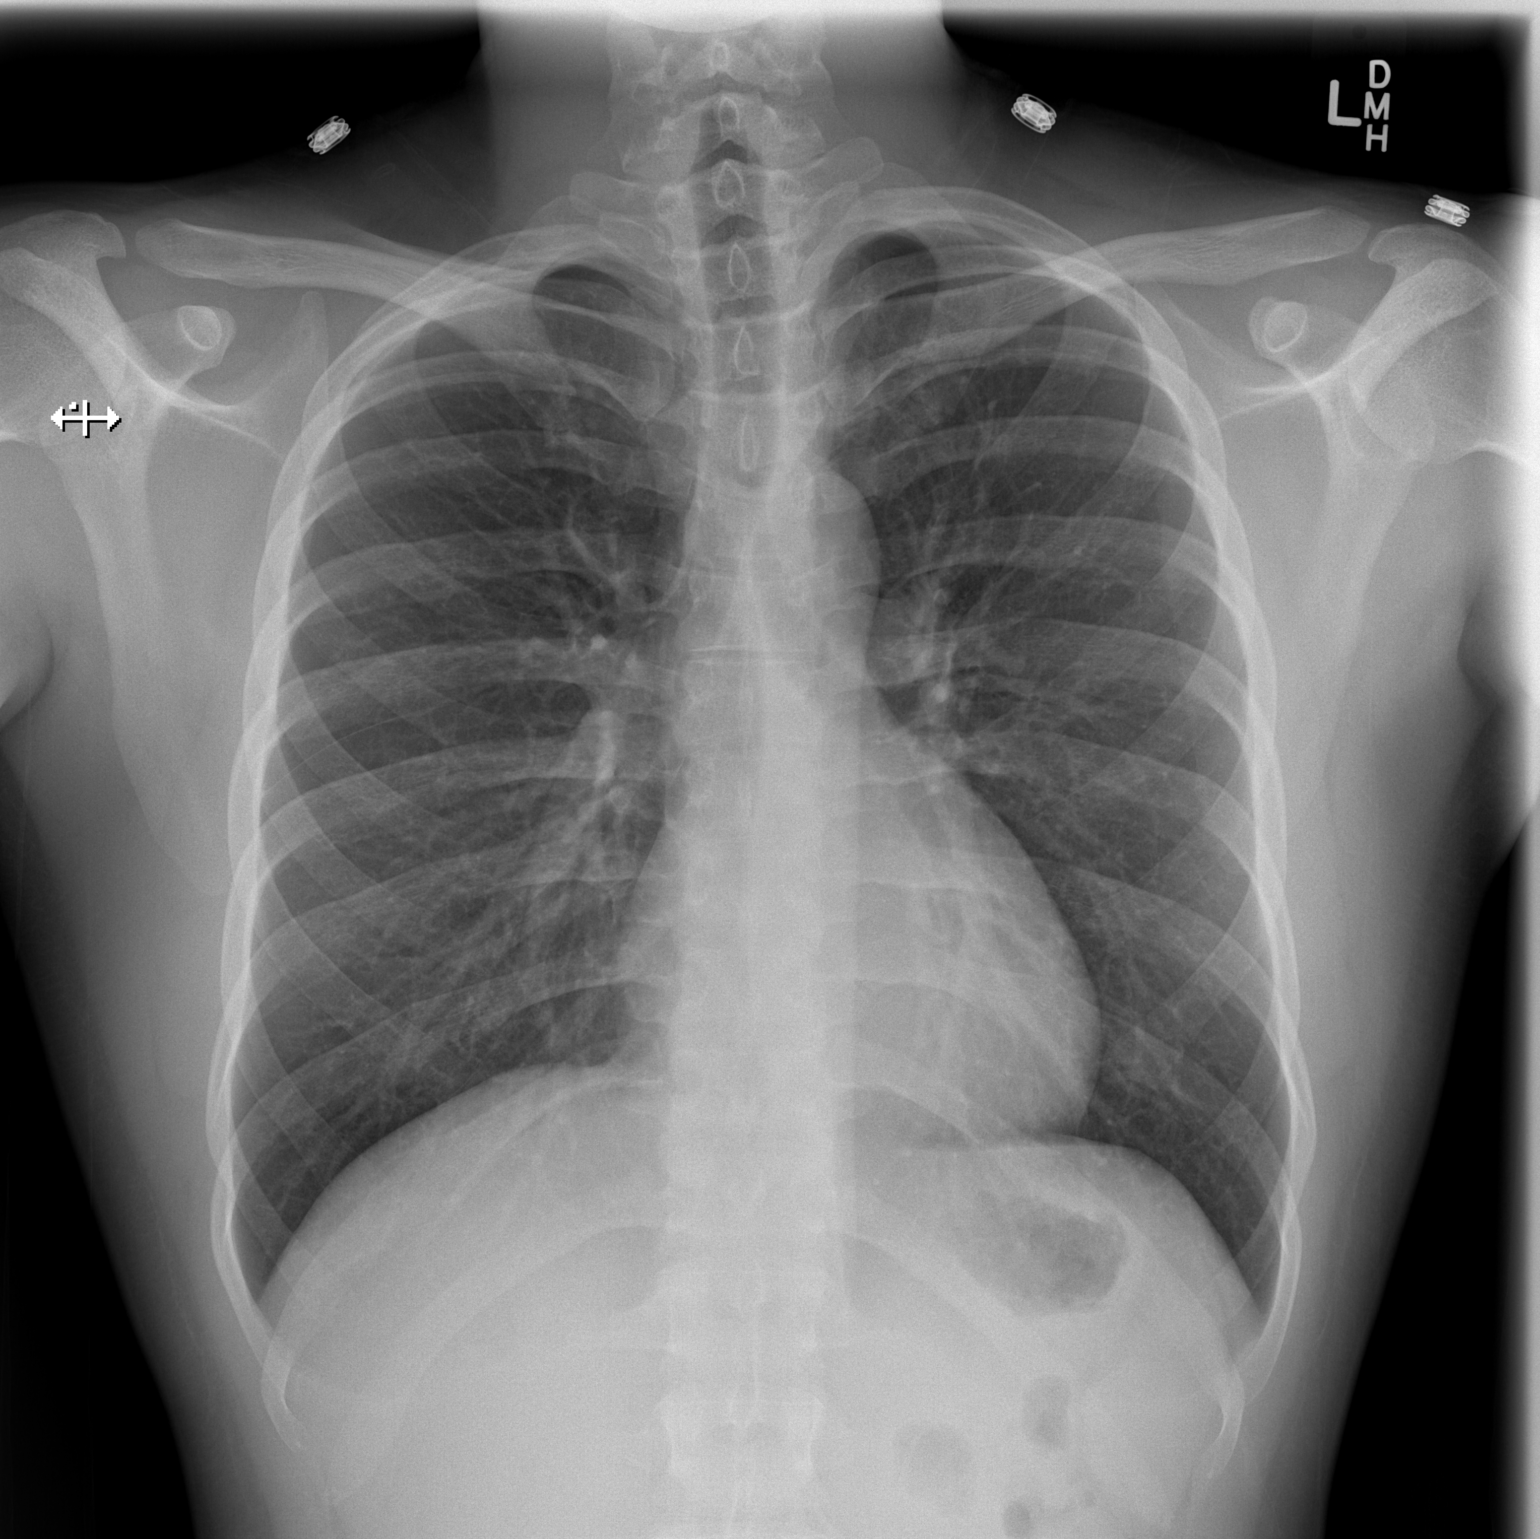

[w chest lat]
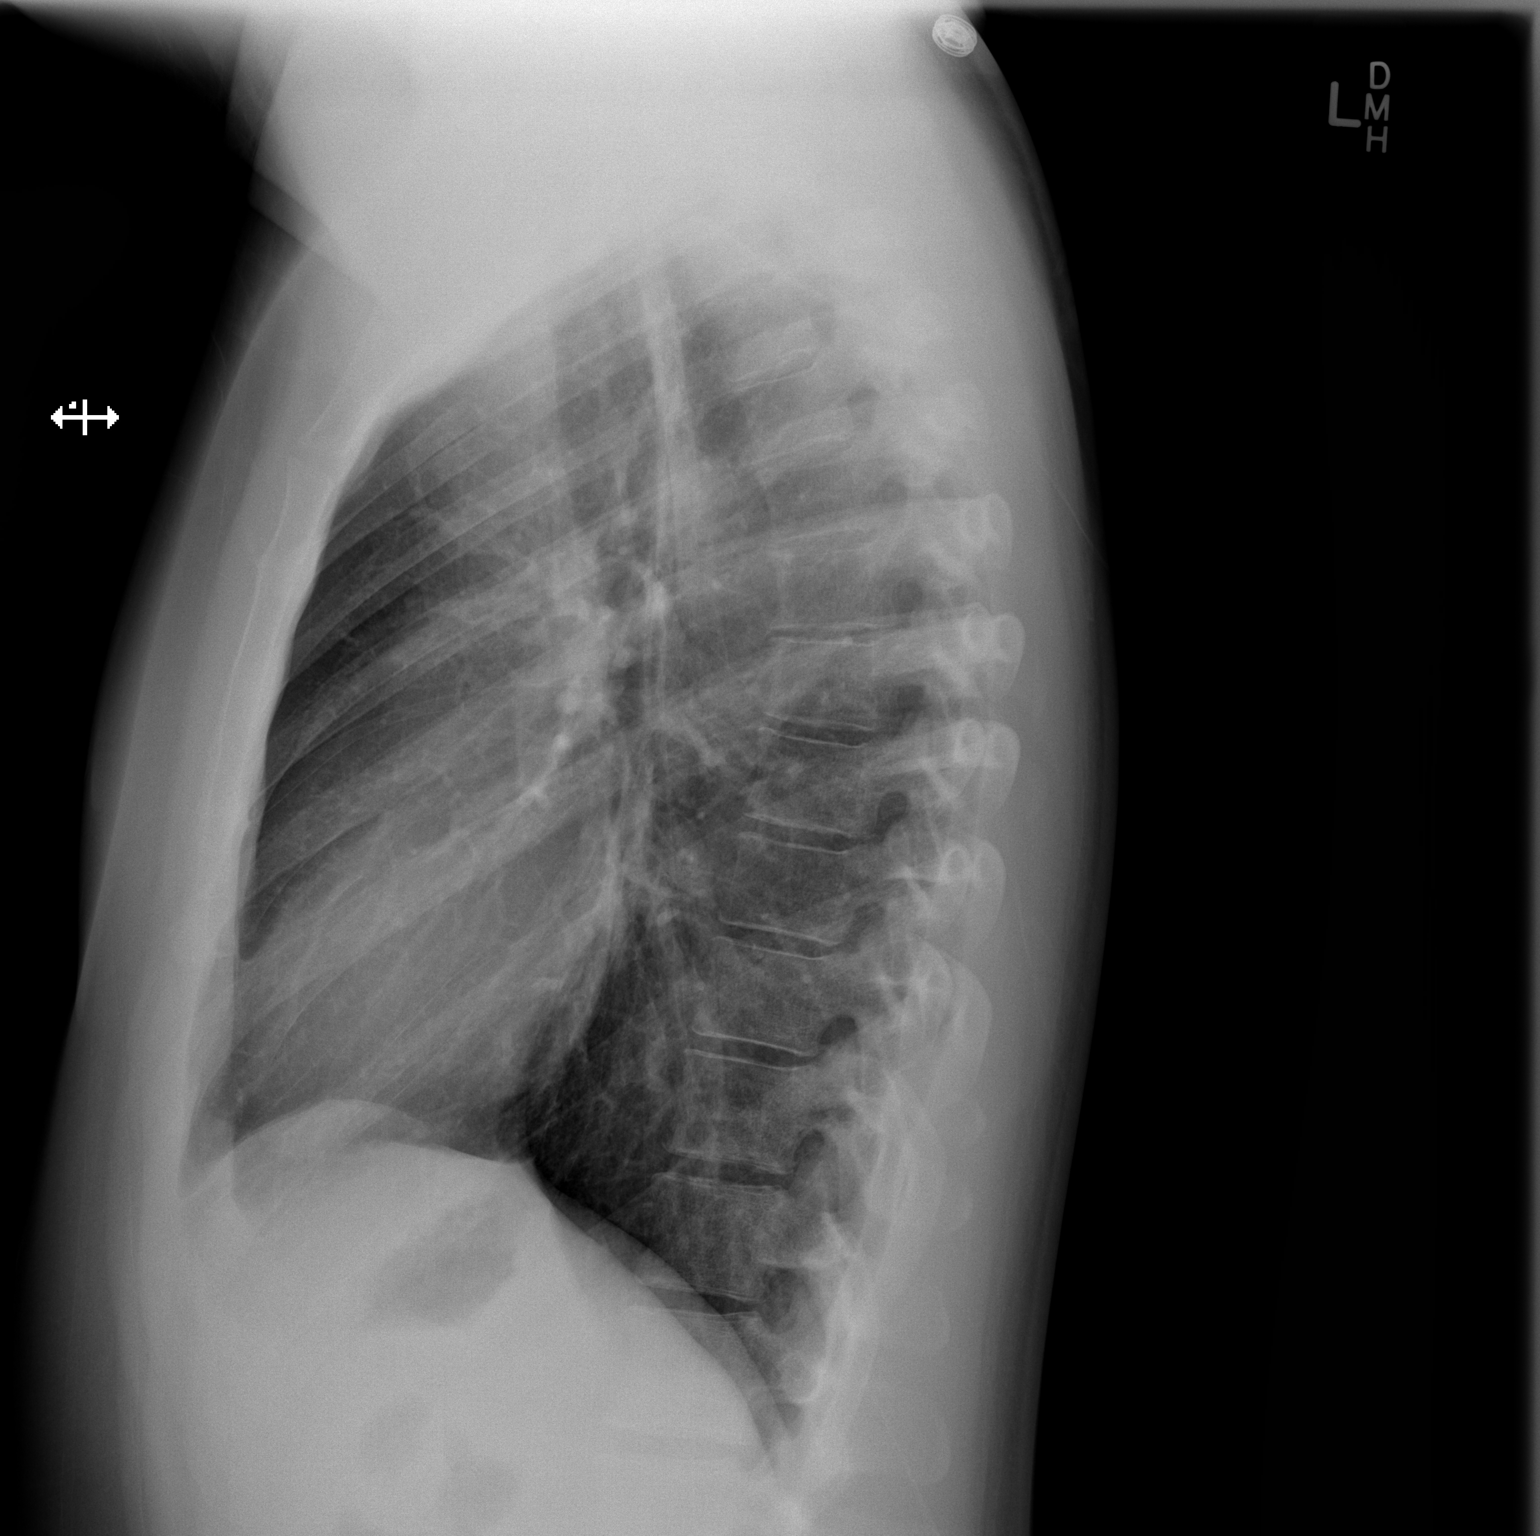

[2 of 2 positions shown; findings below may reference images not displayed]

FINDINGS: The heart size and mediastinal contours are within normal limits.
Both lungs are clear. The visualized skeletal structures are
unremarkable.
IMPRESSION: No active cardiopulmonary disease.

## 2022-02-03 ENCOUNTER — Ambulatory Visit (HOSPITAL_COMMUNITY)
Admission: EM | Admit: 2022-02-03 | Discharge: 2022-02-03 | Disposition: A | Discharge status: Home / Self Care | Payer: Self-pay | Attending: Physician Assistant | Admitting: Physician Assistant

## 2022-02-03 ENCOUNTER — Encounter (HOSPITAL_COMMUNITY): Payer: Self-pay | Admitting: Emergency Medicine

## 2022-02-03 DIAGNOSIS — R42 Dizziness and giddiness: Secondary | ICD-10-CM

## 2022-02-03 LAB — POCT URINALYSIS DIPSTICK, ED / UC
Bilirubin Urine: NEGATIVE
Glucose, UA: NEGATIVE mg/dL
Hgb urine dipstick: NEGATIVE
Ketones, ur: NEGATIVE mg/dL
Leukocytes,Ua: NEGATIVE
Nitrite: NEGATIVE
Protein, ur: NEGATIVE mg/dL
Specific Gravity, Urine: 1.025 (ref 1.005–1.030)
Urobilinogen, UA: 1 mg/dL (ref 0.0–1.0)
pH: 5.5 (ref 5.0–8.0)

## 2022-02-03 LAB — CBG MONITORING, ED: Glucose-Capillary: 89 mg/dL (ref 70–99)

## 2022-02-03 MED ORDER — MECLIZINE HCL 25 MG PO TABS: 25.0000 mg | ORAL_TABLET | Freq: Three times a day (TID) | ORAL | 0 refills | Status: AC | PRN

## 2022-02-03 NOTE — ED Triage Notes (Signed)
Pt is present today with c/o dizziness. Pt sx started Friday evening  ?

## 2022-02-03 NOTE — ED Provider Notes (Signed)
?[facility] ? ? ? ?CSN: [id number] ?Arrival date & time: 02/03/22  1028 ? ? ?  ? ?History   ?Chief Complaint ?Chief Complaint  ?Patient presents with  ? Dizziness  ? ? ?HPI ?Jimmy Barr is a 31 y.o. male.  ? ?Patient presents today with a several day history of dizziness.  He describes this as a room spinning sensation that is worse when he changes position or stands up too fast.  Denies any near-syncope, syncope, weakness, nausea, vomiting, dysarthria, facial asymmetry, visual disturbance.  He denies any recent head injury.  Denies medication changes.  He does have a history of intermittent vertigo that is often triggered by being overheated.  Reports that prior to current symptom onset he was cooking in the kitchen and believes that he got overheated causing symptoms.  He has not tried any over-the-counter medications for symptom management.  He denies history of cardiovascular disease, hypertension, hyperlipidemia, diabetes. ? ? ?History reviewed. No pertinent past medical history. ? ?Patient Active Problem List  ? Diagnosis Date Noted  ? Major depressive disorder, recurrent episode, severe (HCC) 08/04/2019  ? Homicidal ideation 08/01/2019  ? 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 ? ?History reviewed. No pertinent surgical history. ? ? ? ? ?Home Medications   ? ?Prior to Admission medications   ?Medication Sig Start Date End Date Taking? Authorizing Provider  ?meclizine (ANTIVERT) 25 MG tablet Take 1 tablet (25 mg total) by mouth 3 (three) times daily as needed for dizziness. 02/03/22  Yes Kara Mierzejewski, Noberto Retort, PA-C  ? ? ?Family History ?History reviewed. No pertinent family history. ? ?Social History ?Social History  ? ?Tobacco Use  ? Smoking status: Some Days  ?  Packs/day: 0.10  ?  Types: Cigarettes  ? Smokeless tobacco: Never  ?Substance Use Topics  ? Alcohol use: Yes  ?  Comment: social  ? Drug use: Yes  ?  Types: Marijuana  ? ? ? ?Allergies   ?Patient has  no known allergies. ? ? ?Review of Systems ?Review of Systems  ?Constitutional:  Positive for activity change. Negative for appetite change, fatigue and fever.  ?Respiratory:  Negative for cough and shortness of breath.   ?Cardiovascular:  Negative for chest pain.  ?Neurological:  Positive for dizziness. Negative for syncope, facial asymmetry, speech difficulty, weakness, light-headedness, numbness and headaches.  ? ? ?Physical Exam ?Triage Vital Signs ?ED Triage Vitals  ?Enc Vitals Group  ?   BP 02/03/22 1152 126/86  ?   Pulse Rate 02/03/22 1152 62  ?   Resp 02/03/22 1152 18  ?   Temp 02/03/22 1152 98.3 ?F (36.8 ?C)  ?   Temp src --   ?   SpO2 02/03/22 1152 96 %  ?   Weight --   ?   Height --   ?   Head Circumference --   ?   Peak Flow --   ?   Pain Score 02/03/22 1151 0  ?   Pain Loc --   ?   Pain Edu? --   ?   Excl. in GC? --   ? ?No data found. ? ?Updated Vital Signs ?BP 126/86   Pulse 62   Temp 98.3 ?F (36.8 ?C)   Resp 18   SpO2 96%  ? ?Visual Acuity ?Right Eye Distance:   ?Left Eye Distance:   ?Bilateral Distance:   ? ?Right Eye Near:   ?Left Eye Near:    ?Bilateral Near:    ? ?  Physical Exam ?Vitals reviewed.  ?Constitutional:   ?   General: He is awake.  ?   Appearance: Normal appearance. He is well-developed. He is not ill-appearing.  ?   Comments: Very pleasant male appears stated age in no acute distress  ?HENT:  ?   Head: Normocephalic and atraumatic.  ?   Right Ear: Tympanic membrane, ear canal and external ear normal. Tympanic membrane is not erythematous or bulging.  ?   Left Ear: Tympanic membrane, ear canal and external ear normal. Tympanic membrane is not erythematous or bulging.  ?   Nose: Nose normal.  ?   Mouth/Throat:  ?   Tongue: Tongue does not deviate from midline.  ?   Pharynx: Uvula midline. No oropharyngeal exudate or posterior oropharyngeal erythema.  ?Eyes:  ?   Extraocular Movements: Extraocular movements intact.  ?   Conjunctiva/sclera: Conjunctivae normal.  ?   Pupils: Pupils are  equal, round, and reactive to light.  ?Cardiovascular:  ?   Rate and Rhythm: Normal rate and regular rhythm.  ?   Heart sounds: Normal heart sounds, S1 normal and S2 normal. No murmur heard. ?Pulmonary:  ?   Effort: Pulmonary effort is normal. No accessory muscle usage or respiratory distress.  ?   Breath sounds: Normal breath sounds. No stridor. No wheezing, rhonchi or rales.  ?   Comments: Clear to auscultation bilaterally ?Musculoskeletal:  ?   Cervical back: Normal range of motion and neck supple. No spinous process tenderness or muscular tenderness.  ?   Comments: Strength 5/5 bilateral upper and lower extremities  ?Lymphadenopathy:  ?   Head:  ?   Right side of head: No submental, submandibular or tonsillar adenopathy.  ?   Left side of head: No submental, submandibular or tonsillar adenopathy.  ?Neurological:  ?   General: No focal deficit present.  ?   Mental Status: He is alert and oriented to person, place, and time.  ?   Cranial Nerves: Cranial nerves 2-12 are intact.  ?   Coordination: Coordination is intact. Romberg sign negative.  ?   Comments: No focal neurological defect on exam.  ?Psychiatric:     ?   Behavior: Behavior is cooperative.  ? ? ? ?UC Treatments / Results  ?Labs ?(all labs ordered are listed, but only abnormal results are displayed) ?Labs Reviewed  ?POCT URINALYSIS DIPSTICK, ED / UC  ?CBG MONITORING, ED  ? ? ?EKG ? ? ?Radiology ?No results found. ? ?Procedures ?Procedures (including critical care time) ? ?Medications Ordered in UC ?Medications - No data to display ? ?Initial Impression / Assessment and Plan / UC Course  ?I have reviewed the triage vital signs and the nursing notes. ? ?Pertinent labs & imaging results that were available during my care of the patient were reviewed by me and considered in my medical decision making (see chart for details). ? ?  ? ?Vital signs and physical exam reassuring today; no indication for emergent evaluation or imaging.  Blood sugar was appropriate.   UA showed no significant dehydration.  Symptoms consistent with vertigo.  We will start meclizine 3 times a day as needed for symptomatic management.  Recommended drinking plenty of fluids and eating small frequent meals.  He was provided work excuse note.  Discussed that if anything worsens and he develops persistent symptoms, near syncope/syncope, nausea/vomiting, dysarthria, confusion, weakness he needs to go to the emergency room to which she expressed understanding.  Strict return precautions given to which he expressed understanding. ? ?  Final Clinical Impressions(s) / UC Diagnoses  ? ?Final diagnoses:  ?Vertigo  ?Dizziness  ? ? ? ?Discharge Instructions   ? ?  ?Your blood sugar and urine were normal.  I believe that you have vertigo.  Please start meclizine 3 times a day as needed.  Make sure you rest and drink plenty of fluid.  Eat small frequent meals.  If you have worsening/persistent symptoms or if anything changes and you develop headache, lightheadedness, passing out, nausea/vomiting, weakness, confusion you need to go to the emergency room immediately for further evaluation and management. ? ? ? ? ?ED Prescriptions   ? ? Medication Sig Dispense Auth. Provider  ? meclizine (ANTIVERT) 25 MG tablet Take 1 tablet (25 mg total) by mouth 3 (three) times daily as needed for dizziness. 30 tablet Chanae Gemma K, PA-C  ? ?  ? ?PDMP not reviewed this encounter. ?  ?Jeani Hawking, PA-C ?02/03/22 1313 ? ?

## 2022-02-03 NOTE — Discharge Instructions (Signed)
Your blood sugar and urine were normal.  I believe that you have vertigo.  Please start meclizine 3 times a day as needed.  Make sure you rest and drink plenty of fluid.  Eat small frequent meals.  If you have worsening/persistent symptoms or if anything changes and you develop headache, lightheadedness, passing out, nausea/vomiting, weakness, confusion you need to go to the emergency room immediately for further evaluation and management. ?
